# Patient Record
Sex: Female | Born: 1985 | Race: Asian | Hispanic: No | Marital: Single | Smoking: Never smoker
Health system: Southern US, Community
[De-identification: ages and names within clinical notes are randomized; demographics above are authoritative.]

## PROBLEM LIST (undated history)

## (undated) DIAGNOSIS — G932 Benign intracranial hypertension: Secondary | ICD-10-CM

## (undated) DIAGNOSIS — J189 Pneumonia, unspecified organism: Secondary | ICD-10-CM

## (undated) DIAGNOSIS — K219 Gastro-esophageal reflux disease without esophagitis: Secondary | ICD-10-CM

## (undated) DIAGNOSIS — H4710 Unspecified papilledema: Secondary | ICD-10-CM

## (undated) HISTORY — DX: Pneumonia, unspecified organism: J18.9

## (undated) HISTORY — DX: Benign intracranial hypertension: G93.2

## (undated) HISTORY — DX: Gastro-esophageal reflux disease without esophagitis: K21.9

## (undated) HISTORY — DX: Unspecified papilledema: H47.10

---

## 2011-01-11 ENCOUNTER — Inpatient Hospital Stay (HOSPITAL_COMMUNITY)
Admission: AD | Admit: 2011-01-11 | Discharge: 2011-01-11 | Disposition: A | Discharge status: Home / Self Care | Payer: Medicaid Other | Source: Ambulatory Visit | Attending: Obstetrics and Gynecology | Admitting: Obstetrics and Gynecology

## 2011-01-11 DIAGNOSIS — O4700 False labor before 37 completed weeks of gestation, unspecified trimester: Secondary | ICD-10-CM | POA: Insufficient documentation

## 2011-01-12 ENCOUNTER — Inpatient Hospital Stay (HOSPITAL_COMMUNITY)
Admission: RE | Admit: 2011-01-12 | Discharge: 2011-01-12 | Disposition: A | Discharge status: Home / Self Care | Payer: Medicaid Other | Source: Ambulatory Visit | Attending: Obstetrics and Gynecology | Admitting: Obstetrics and Gynecology

## 2011-01-12 DIAGNOSIS — O4700 False labor before 37 completed weeks of gestation, unspecified trimester: Secondary | ICD-10-CM | POA: Insufficient documentation

## 2011-02-14 ENCOUNTER — Encounter (HOSPITAL_COMMUNITY): Payer: Self-pay | Admitting: *Deleted

## 2011-02-14 ENCOUNTER — Inpatient Hospital Stay (HOSPITAL_COMMUNITY)
Admission: EM | Admit: 2011-02-14 | Discharge: 2011-02-16 | DRG: 775 | Disposition: A | Discharge status: Home / Self Care | Payer: Medicaid Other | Source: Ambulatory Visit | Attending: Obstetrics and Gynecology | Admitting: Obstetrics and Gynecology

## 2011-02-14 DIAGNOSIS — O30009 Twin pregnancy, unspecified number of placenta and unspecified number of amniotic sacs, unspecified trimester: Secondary | ICD-10-CM | POA: Diagnosis present

## 2011-02-14 DIAGNOSIS — O30049 Twin pregnancy, dichorionic/diamniotic, unspecified trimester: Secondary | ICD-10-CM

## 2011-02-14 DIAGNOSIS — O3090 Multiple gestation, unspecified, unspecified trimester: Secondary | ICD-10-CM | POA: Diagnosis present

## 2011-02-14 LAB — CBC
MCV: 81.3 fL (ref 78.0–100.0)
Platelets: 151 10*3/uL (ref 150–400)
RBC: 4.93 MIL/uL (ref 3.87–5.11)
RDW: 17.3 % — ABNORMAL HIGH (ref 11.5–15.5)
WBC: 6.9 10*3/uL (ref 4.0–10.5)

## 2011-02-14 LAB — RPR: RPR Ser Ql: NONREACTIVE

## 2011-02-15 LAB — CBC
Hemoglobin: 10.6 g/dL — ABNORMAL LOW (ref 12.0–15.0)
MCH: 24.9 pg — ABNORMAL LOW (ref 26.0–34.0)
Platelets: 133 10*3/uL — ABNORMAL LOW (ref 150–400)
RBC: 4.25 MIL/uL (ref 3.87–5.11)

## 2011-02-17 ENCOUNTER — Ambulatory Visit (HOSPITAL_COMMUNITY)
Admission: AD | Admit: 2011-02-17 | Discharge: 2011-02-19 | Disposition: A | Discharge status: Home / Self Care | Payer: Medicaid Other | Source: Ambulatory Visit | Attending: Obstetrics and Gynecology | Admitting: Obstetrics and Gynecology

## 2011-02-17 ENCOUNTER — Inpatient Hospital Stay (HOSPITAL_COMMUNITY): Payer: Medicaid Other

## 2011-02-17 DIAGNOSIS — O8612 Endometritis following delivery: Secondary | ICD-10-CM | POA: Insufficient documentation

## 2011-02-17 DIAGNOSIS — N719 Inflammatory disease of uterus, unspecified: Secondary | ICD-10-CM | POA: Insufficient documentation

## 2011-02-17 LAB — COMPREHENSIVE METABOLIC PANEL
BUN: 10 mg/dL (ref 6–23)
CO2: 24 mEq/L (ref 19–32)
Calcium: 8.2 mg/dL — ABNORMAL LOW (ref 8.4–10.5)
Creatinine, Ser: 0.72 mg/dL (ref 0.4–1.2)
GFR calc non Af Amer: >60 mL/min (ref >60)
Glucose, Bld: 80 mg/dL (ref 70–99)

## 2011-02-17 LAB — CBC
HCT: 34.9 % — ABNORMAL LOW (ref 36.0–46.0)
Platelets: 140 10*3/uL — ABNORMAL LOW (ref 150–400)
RBC: 4.35 MIL/uL (ref 3.87–5.11)
RDW: 17.6 % — ABNORMAL HIGH (ref 11.5–15.5)
WBC: 12 10*3/uL — ABNORMAL HIGH (ref 4.0–10.5)

## 2011-02-17 LAB — URINALYSIS, ROUTINE W REFLEX MICROSCOPIC
Nitrite: NEGATIVE
Specific Gravity, Urine: 1.01 (ref 1.005–1.030)
Urobilinogen, UA: 0.2 mg/dL (ref 0.0–1.0)
pH: 7.5 (ref 5.0–8.0)

## 2011-02-17 LAB — URINE MICROSCOPIC-ADD ON

## 2011-02-19 LAB — WOUND CULTURE

## 2011-02-21 NOTE — Discharge Summary (Signed)
NAMESIRI, BUEGE NO.:  [id number]  MEDICAL RECORD NO.:  [id number]           PATIENT TYPE:  I  LOCATION:  9106                          [facility]ILITY:  [facility]  PHYSICIAN:  Zenaida Niece, M.D.DATE OF BIRTH:  17-Apr-1986  DATE OF ADMISSION:  02/14/2011 DATE OF DISCHARGE:  02/16/2011                              DISCHARGE SUMMARY   ADMISSION DIAGNOSIS:  Intrauterine pregnancy with dichorionic-diamniotic twins at 37 weeks.  DISCHARGE DIAGNOSIS:  Intrauterine pregnancy with dichorionic-diamniotic twins at 37 weeks.  PROCEDURES:  On May 28, she had vacuum-assisted vaginal delivery of twins.  HISTORY AND PHYSICAL:  This is a 25 year old gravida 2, para 0-0-1-0 with an EGA of 37+ weeks with di-di twins who presents for induction. Prenatal care complicated only by the twins with concordant growth and vertex vertex by last ultrasound.  PRENATAL LABORATORY FINDINGS:  Blood type AB positive with negative antibody screen, rubella immune, hepatitis B surface antigen negative. Quad screen normal.  Glucola 145.  GTT normal.  Group B strep is negative.  PAST OBSTETRIC HISTORY:  One elective termination.  PAST MEDICAL HISTORY:  Chlamydia.  PAST SURGICAL HISTORY:  D and E.  Physical exam, she is afebrile with stable vital signs.  Fetal heart tracing category 1 x2 with contractions every 3-4 minutes on Pitocin. Abdomen gravid, nontender.  Cervix is 5-6, 80, -1, vertex presentation and membranes were ruptured revealing clear fluid.  HOSPITAL COURSE:  The patient was admitted and started on low-dose Pitocin.  I then ruptured membranes also for augmentation.  She progressed to 7 cm and a fetal scalp electrode was placed on baby A. She then had a slightly protracted course but did eventually reached complete and pushed for approximately 2-1/2 to 3 hours.  She did not push well.  She did have an epidural.  Risks of assisted delivery were discussed and she agreed.   Vacuum-assisted vaginal delivery of baby A was performed for a viable female infant with Apgars of 8 and 9 that weighed 6 pounds 14 ounces.  Baby B was then found to be in the breech presentation.  External cephalic version was performed to convert the baby to vertex.  Membranes were ruptured revealing clear fluid.  She was able to bring the baby vertex to +2 but then was unable to push past there.  Vacuum-assisted vaginal delivery of baby B was then performed for a viable female infant with Apgars of 8 and 9 that weighed 6 pounds 7 ounces.  Placenta was delivered spontaneously separately.  She had a third-degree laceration repaired with 2-0 Vicryl for the sphincter and 3- 0 Vicryl for the remainder and rectum was intact.  Estimated blood loss was 600 mL.  Postpartum, she had no significant complications.  She did have temperature to 100.1 but no higher.  Predelivery hemoglobin 12.6, postdelivery 10.6.  On postpartum day #2, she was felt to be stable enough for discharge home.  DISCHARGE INSTRUCTIONS:  Regular diet, pelvic rest.  Follow up in 6 weeks. Medications; ibuprofen 600 mg p.o. q.6 h. p.r.n. #30 and Colace #100 one p.o. b.i.d.  Zenaida Niece, M.D.     TDM/MEDQ  D:  02/16/2011  T:  02/16/2011  Job:  [id number]  Electronically Signed by Lavina Hamman M.D. on 02/21/2011 08:14:58 AM

## 2011-02-23 LAB — CULTURE, BLOOD (ROUTINE X 2)
Culture  Setup Time: 201205311934
Culture  Setup Time: 201205311934
Culture: NO GROWTH
Culture: NO GROWTH

## 2011-02-24 LAB — ANAEROBIC CULTURE

## 2011-03-01 ENCOUNTER — Inpatient Hospital Stay (HOSPITAL_COMMUNITY): Admission: AD | Admit: 2011-03-01 | Payer: Self-pay | Source: Home / Self Care | Admitting: Family Medicine

## 2011-03-01 NOTE — Discharge Summary (Signed)
  [name], [name] NO.:  [id number]  MEDICAL RECORD NO.:  [id number]  LOCATION:  9305                          [facility]ILITY[facility]  [facility]  PHYSICIAN:  Zenaida Niece, M.D.DATE OF BIRTH:  1986-01-02  DATE OF ADMISSION:  02/17/2011 DATE OF DISCHARGE:  02/19/2011                              DISCHARGE SUMMARY   ADMISSION DIAGNOSIS:  Postpartum day #3 with probable postpartum endometritis.  DISCHARGE DIAGNOSIS:  Postpartum day #3 with probable postpartum endometritis.  PROCEDURES:  IV antibiotics.  HISTORY AND PHYSICAL:  This is a 25 year old Asian female, para 1-0-1-2 who is 3 days status post spontaneous vaginal delivery of twins.  She was discharged on the afternoon of Feb 16, 2011.  Postpartum course was uncomplicated.  No significant fever, although she did have temperatures up to 100, but no other signs or symptoms of infection.  She presents with complaint of chills and shaking, and temperature to 103.  She has some lower abdominal pain, but no other symptoms.  PHYSICAL EXAMINATION:  VITAL SIGNS:  Significant for temperature of 103.2, blood pressure 144/70, pulse 102. HEENT:  Pharynx was clear. HEART:  Tachycardic, no murmur. LUNGS:  Clear. BREASTS:  Soft, no mass or tenderness. ABDOMEN:  Fundus was firm and deviated to the right and was tender above the umbilicus. PELVIC:  Had normal lochia.  Cervix was clear.  Uterus was tender.  HOSPITAL COURSE:  The patient was admitted by Dr. Ambrose Mantle after evaluation.  He ordered blood, urine, and endometrial cultures.  She was started on IV Unasyn.  Later that day within 12 hours of starting antibiotics, she had another fever, so Dr. Ellyn Hack changed her to Zosyn. T-max on Feb 17, 2011 was 102.9.  On the morning of February 18, 2011, uterus was still tender, at that point, she had been afebrile for about 12 hours.  Cultures at that point were negative.  She then continue to remain afebrile on Zosyn and on the morning of  February 19, 2011, had been afebrile for greater than 36 hours.  Fundus had decreased uterine tenderness.  Again, all cultures were negative.  She was felt to be stable enough for discharge home.  DISCHARGE INSTRUCTIONS:  Regular diet, pelvic rest.  Follow up in several days to make sure she is doing well and she is discharged on Augmentin 875 mg p.o. b.i.d. for 7 days.  She is to continue over-the- counter ibuprofen as needed.     Zenaida Niece, M.D.     TDM/MEDQ  D:  02/24/2011  T:  02/24/2011  Job:  [id number]  Electronically Signed by Lavina Hamman M.D. on 03/01/2011 08:34:37 AM

## 2011-03-04 NOTE — H&P (Signed)
Caroline Charles, CHICO NO.:  [id number]  MEDICAL RECORD NO.:  [id number]           PATIENT TYPE:  O  LOCATION:  [facility]                         [facility]  PHYSICIAN:  Malachi Pro. Ambrose Mantle, M.D. DATE OF BIRTH:  [birth year]  DATE OF ADMISSION:  02/17/2011 DATE OF DISCHARGE:                             HISTORY & PHYSICAL   This is a 25 year old Asian female para 1-0-1-2 admitted 3 days postpartum with fever to 103.2 degrees.  This patient delivered twins on the evening of Feb 14, 2011.  The labor was induced, both twins were delivered vertex, the first by vacuum after about 3 hours of pushing. The second by vacuum after external rotation from breech to vertex.  On the first postpartum day, the patient had temperature elevation to 100.0 degrees and she reports that a couple of hours after she delivered, she had severe chills.  After discharge, she did well until approximately 11:45 p.m. on the day of discharge Feb 16, 2011, when she had the onset of chills and was shaking so much she could not control her body.  She came here for evaluation.  She denied sore throat, cough, neck stiffness, dysuria.  She has had some lower abdominal pain.  PAST MEDICAL HISTORY:  She has no known allergies.  OPERATIONS:  She did have a D and E in 2006 for abortion.  She gave no significant medical history.  Denied alcohol, tobacco, and drugs.  FAMILY HISTORY:  Mother had high blood pressure.  OBSTETRICAL HISTORY:  In 2006, she had a therapeutic abortion and on Feb 14, 2011, she delivered twins, both vaginally, baby A was a 6 pounds 14 ounce female, baby B a 6 pounds 7 ounce female.  PHYSICAL EXAMINATION:  VITAL SIGNS:  Temperature is 103.2, blood pressure 144/70, pulse 102, respirations 18. GENERAL:  She is a well-developed, well-nourished Asian female.  She is in distress from high fever. NECK:  Pharynx is clear.  The neck is supple. HEART:  Normal size and sounds.  There is a mild  tachycardia.  There are no murmurs. LUNGS:  Clear to auscultation. BREASTS:  Soft.  There is no redness or tenderness. ABDOMEN:  Soft. EXTERNAL GENITALIA:  Uterus is enlarged above the umbilicus, deviated to the right, and fundus is palpable in the right upper quadrant.  Uterus is quite tender.  The legs are negative for erythema or a unilateral swelling.  Vulva and vagina show moderate dark lochia.  The cervix is clean.  The uterus is tender and enlarged, deviated to the right.  The adnexa are clear.  ADMITTING IMPRESSION:  Presumed endometritis.  Cath urine revealed only 3 to 6 white cells, few bacteria.  It is sent for culture.  CBC is normal except for white count of 12,000, platelet count of 140,000. Comprehensive metabolic profile is normal except for slightly low potassium, protein, and albumin.Marland Kitchen  Ultrasound revealed no evidence of retained products of conception but the endometrial complex was suggestive of endometritis.  The patient has been given Tylenol for fever, placed on Unasyn after cultures were obtained from the blood and endometrial  cavity.     Malachi Pro. Ambrose Mantle, M.D.     TFH/MEDQ  D:  02/17/2011  T:  02/17/2011  Job:  [id number]  Electronically Signed by Tracey Harries M.D. on 03/04/2011 08:56:54 AM

## 2011-03-28 ENCOUNTER — Other Ambulatory Visit: Payer: Self-pay | Admitting: Obstetrics and Gynecology

## 2011-03-28 DIAGNOSIS — N63 Unspecified lump in unspecified breast: Secondary | ICD-10-CM

## 2011-03-29 ENCOUNTER — Ambulatory Visit
Admission: RE | Admit: 2011-03-29 | Discharge: 2011-03-29 | Disposition: A | Discharge status: Home / Self Care | Payer: Medicaid Other | Source: Ambulatory Visit | Attending: Obstetrics and Gynecology | Admitting: Obstetrics and Gynecology

## 2011-03-29 DIAGNOSIS — N63 Unspecified lump in unspecified breast: Secondary | ICD-10-CM

## 2014-07-21 ENCOUNTER — Encounter (HOSPITAL_COMMUNITY): Payer: Self-pay | Admitting: *Deleted

## 2016-02-26 ENCOUNTER — Telehealth: Payer: Self-pay | Admitting: Gastroenterology

## 2016-02-26 ENCOUNTER — Encounter (HOSPITAL_COMMUNITY): Payer: Self-pay | Admitting: Emergency Medicine

## 2016-02-26 ENCOUNTER — Emergency Department (HOSPITAL_COMMUNITY)
Admission: EM | Admit: 2016-02-26 | Discharge: 2016-02-26 | Disposition: A | Discharge status: Home / Self Care | Payer: BLUE CROSS/BLUE SHIELD | Attending: Emergency Medicine | Admitting: Emergency Medicine

## 2016-02-26 DIAGNOSIS — R0989 Other specified symptoms and signs involving the circulatory and respiratory systems: Secondary | ICD-10-CM | POA: Insufficient documentation

## 2016-02-26 MED ORDER — LIDOCAINE VISCOUS 2 % MT SOLN
15.0000 mL | Freq: Once | OROMUCOSAL | Status: AC
  Administered 2016-02-26: 15 mL via OROMUCOSAL
  Filled 2016-02-26: qty 15

## 2016-02-26 NOTE — Telephone Encounter (Signed)
Caroline Charles from patient PCP office states that this referral is not needed. She states that patient PCP has advised patient to go to the ED.

## 2016-02-26 NOTE — ED Notes (Signed)
Feels like that there is something in throat after eating last night ,  Pt has NO  Breathing issues and is handling secretions very well pulse ox 100 states may be lemon grass

## 2016-03-11 NOTE — ED Provider Notes (Signed)
CSN: [id number]     Arrival date & time 02/26/16  1119 History   First MD Initiated Contact with Patient 02/26/16 1205     Chief Complaint  Patient presents with  . Foreign Body     (Consider location/radiation/quality/duration/timing/severity/associated sxs/prior Treatment) HPI   30 year old female with foreign body sensation in her throat. Patient receiving a normal at the time symptom onset. She feels like she may have a piece of grass stuck in her throat. She can swallow both solids and liquids. They stay down. Her irritation/pain is worse with swallowing though. No cough. No drooling. No wheezing. No intervention prior to arrival.  History reviewed. No pertinent past medical history. History reviewed. No pertinent past surgical history. No family history on file. Social History  Substance Use Topics  . Smoking status: Never Smoker   . Smokeless tobacco: None  . Alcohol Use: None   OB History    Gravida Para Term Preterm AB TAB SAB Ectopic Multiple Living   1              Review of Systems  All systems reviewed and negative, other than as noted in HPI.   Allergies  Review of patient's allergies indicates no known allergies.  Home Medications   Prior to Admission medications   Not on File   BP 126/84 mmHg  Pulse 65  Temp(Src) 98.2 F (36.8 C) (Oral)  Resp 16  SpO2 100% Physical Exam  Constitutional: She appears well-developed and well-nourished. No distress.  HENT:  Head: Normocephalic and atraumatic.  Oropharynx is clear. Normal sounding voice. Handling secretions. Neck is supple. No stridor.  Eyes: Conjunctivae are normal. Right eye exhibits no discharge. Left eye exhibits no discharge.  Neck: Neck supple.  Cardiovascular: Normal rate, regular rhythm and normal heart sounds.  Exam reveals no gallop and no friction rub.   No murmur heard. Pulmonary/Chest: Effort normal and breath sounds normal. No respiratory distress.  Abdominal: Soft. She exhibits no  distension. There is no tenderness.  Musculoskeletal: She exhibits no edema or tenderness.  Neurological: She is alert.  Skin: Skin is warm and dry.  Psychiatric: She has a normal mood and affect. Her behavior is normal. Thought content normal.  Nursing note and vitals reviewed.   ED Course  Procedures (including critical care time) Labs Review Labs Reviewed - No data to display  Imaging Review No results found. I have personally reviewed and evaluated these images and lab results as part of my medical decision-making.   EKG Interpretation None      MDM   Final diagnoses:  Foreign body sensation in throat    30 year old female with foreign body sensation in her throat. Suspect esophageal irritation. Doubt foreign body. She can swallow both solids and liquids. She is in no distress. Symptoms improved with viscous lidocaine. It has been determined that no acute conditions requiring further emergency intervention are present at this time. The patient has been advised of the diagnosis and plan. I reviewed any labs and imaging including any potential incidental findings. We have discussed signs and symptoms that warrant return to the ED and they are listed in the discharge instructions.      Raeford RazorStephen Paiton Boultinghouse, MD 03/11/16 2109

## 2019-11-21 ENCOUNTER — Ambulatory Visit: Payer: Self-pay

## 2019-11-24 ENCOUNTER — Ambulatory Visit: Payer: Self-pay | Attending: Internal Medicine

## 2019-11-24 DIAGNOSIS — Z23 Encounter for immunization: Secondary | ICD-10-CM | POA: Insufficient documentation

## 2019-11-24 NOTE — Progress Notes (Signed)
   Covid-19 Vaccination Clinic  Name:  Caroline Charles    MRN: [id number] DOB: 06/26/1986  11/24/2019  [name] was observed post Covid-19 immunization for 15 minutes without incident. She was provided with Vaccine Information Sheet and instruction to access the V-Safe system.   [name] was instructed to call 911 with any severe reactions post vaccine: Marland Kitchen Difficulty breathing  . Swelling of face and throat  . A fast heartbeat  . A bad rash all over body  . Dizziness and weakness   Immunizations Administered    Name Date Dose VIS Date Route   Pfizer COVID-19 Vaccine 11/24/2019 12:44 PM 0.3 mL 08/30/2019 Intramuscular   Manufacturer: [employer], [employer]   Lot: [id number]   NDC: [address]-5

## 2019-12-25 ENCOUNTER — Ambulatory Visit: Payer: Self-pay | Attending: Internal Medicine

## 2019-12-25 DIAGNOSIS — Z23 Encounter for immunization: Secondary | ICD-10-CM

## 2019-12-25 NOTE — Progress Notes (Signed)
   Covid-19 Vaccination Clinic  Name:  Caroline Charles    MRN: [id number] DOB: 1986/04/17  12/25/2019  Caroline Charles was observed post Covid-19 immunization for 15 minutes without incident. She was provided with Vaccine Information Sheet and instruction to access the V-Safe system.   Caroline Charles was instructed to call 911 with any severe reactions post vaccine: Marland Kitchen Difficulty breathing  . Swelling of face and throat  . A fast heartbeat  . A bad rash all over body  . Dizziness and weakness   Immunizations Administered    Name Date Dose VIS Date Route   Pfizer COVID-19 Vaccine 12/25/2019  8:06 AM 0.3 mL 08/30/2019 Intramuscular   Manufacturer: [employer], [employer]   Lot: [id number]   NDC: [address]-8

## 2020-08-21 LAB — HM PAP SMEAR: HM Pap smear: NORMAL

## 2020-08-21 LAB — RESULTS CONSOLE HPV: CHL HPV: NEGATIVE

## 2021-10-05 DIAGNOSIS — H4711 Papilledema associated with increased intracranial pressure: Secondary | ICD-10-CM | POA: Diagnosis not present

## 2021-10-08 ENCOUNTER — Other Ambulatory Visit: Payer: Self-pay | Admitting: Ophthalmology

## 2021-10-08 DIAGNOSIS — H4711 Papilledema associated with increased intracranial pressure: Secondary | ICD-10-CM

## 2021-10-21 ENCOUNTER — Other Ambulatory Visit: Payer: Self-pay

## 2021-10-21 ENCOUNTER — Ambulatory Visit
Admission: RE | Admit: 2021-10-21 | Discharge: 2021-10-21 | Disposition: A | Discharge status: Home / Self Care | Payer: Self-pay | Source: Ambulatory Visit | Attending: Ophthalmology | Admitting: Ophthalmology

## 2021-10-21 DIAGNOSIS — H4711 Papilledema associated with increased intracranial pressure: Secondary | ICD-10-CM | POA: Diagnosis not present

## 2021-10-21 DIAGNOSIS — H4710 Unspecified papilledema: Secondary | ICD-10-CM | POA: Diagnosis not present

## 2021-10-21 DIAGNOSIS — R930 Abnormal findings on diagnostic imaging of skull and head, not elsewhere classified: Secondary | ICD-10-CM | POA: Diagnosis not present

## 2021-10-21 MED ORDER — GADOBENATE DIMEGLUMINE 529 MG/ML IV SOLN
15.0000 mL | Freq: Once | INTRAVENOUS | Status: AC | PRN
  Administered 2021-10-21: 15 mL via INTRAVENOUS

## 2021-10-23 ENCOUNTER — Other Ambulatory Visit: Payer: Self-pay

## 2021-10-23 ENCOUNTER — Ambulatory Visit
Admission: RE | Admit: 2021-10-23 | Discharge: 2021-10-23 | Disposition: A | Discharge status: Home / Self Care | Payer: Self-pay | Source: Ambulatory Visit | Attending: Ophthalmology | Admitting: Ophthalmology

## 2021-10-23 DIAGNOSIS — Q283 Other malformations of cerebral vessels: Secondary | ICD-10-CM | POA: Diagnosis not present

## 2021-10-23 DIAGNOSIS — H4711 Papilledema associated with increased intracranial pressure: Secondary | ICD-10-CM

## 2021-10-23 DIAGNOSIS — R930 Abnormal findings on diagnostic imaging of skull and head, not elsewhere classified: Secondary | ICD-10-CM | POA: Diagnosis not present

## 2021-10-23 MED ORDER — GADOBENATE DIMEGLUMINE 529 MG/ML IV SOLN
15.0000 mL | Freq: Once | INTRAVENOUS | Status: AC | PRN
  Administered 2021-10-23: 15 mL via INTRAVENOUS

## 2021-11-16 ENCOUNTER — Ambulatory Visit: Payer: 59 | Admitting: Neurology

## 2022-01-07 NOTE — Progress Notes (Addendum)
? ?NEUROLOGY CONSULTATION NOTE ? ?[name] ?MRN: [id number] ?DOB: 11/27/1985 ? ?Referring provider: Arlys John, MD ?Primary care provider: No PCP ? ?Reason for consult:  intracranial hypertension ? ?Assessment/Plan:  ? ?Idiopathic intracranial hypertension ? ?Establish diagnosis with lumbar puncture to record opening pressure - will check for CSF cell count, protein, glucose, gram stain and culture. ?Pending LP results, will start acetazolamide 500mg  twice daily ?Advised to have Mirena removed as it has been documented to potentially cause increased intracranial pressure ?Recommend weight loss ?Follow up in late July. ? ? ?Subjective:  ?Caroline Charles is a 36 year old female right who presents for evaluation of intracranial hypertension.  History supplemented by referring provider's note. ? ? ?On routine eye exam, she was evaluated by ophthalmology who noted bilateral papilledema on exam.  Visual field testing was normal.  MRI of brain, orbits and MRV of head with and without contrast performed on 2/2 and 10/23/2021 personally reviewed revealed partially empty sella, mild CSF prominence within the optic nerve sheaths, subtle intra-ocular protrusion of the optic nerve head and possible narrowing of the bilateral distal transverse sinuses but no mass lesion or dural sinus thrombosis noted.  She does have a Mirena IUD.  Reports some weight gain over the past year.  She reports history of migraines which became almost daily due to presumed stress as they resolved after receiving MRI results stating there was no tumor.  Denies visual obscurations or blurred vision.  Hears what sounds like "crickets" in her head but no tinnitus or pulsatile tinnitus.   ? ?Migraine are typically 8/10 pressure in temples and behind both eyes associated with photophobia and sometimes nausea, usually lasts 2-3 days. ?  ? ? ?PAST MEDICAL HISTORY: ?No past medical history on file. ? ? ?PAST SURGICAL HISTORY: ?No past surgical history on  file. ? ? ?MEDICATIONS: ?Current Outpatient Medications on File Prior to Visit  ?Medication Sig Dispense Refill  ? levonorgestrel (MIRENA, 52 MG,) 20 MCG/DAY IUD Mirena 21 mcg/24 hours (8 yrs) 52 mg intrauterine device ? Take 1 device by intrauterine route.    ? ?No current facility-administered medications on file prior to visit.  ? ? ? ?ALLERGIES: ?No Known Allergies ? ? ?FAMILY HISTORY: ?No family history on file. ? ? ?Objective:  ?Blood pressure 136/83, pulse 93, height 5\' 5"  (1.651 m), weight 175 lb 9.6 oz (79.7 kg), SpO2 98 %, unknown if currently breastfeeding. ?General: No acute distress.  Patient appears well-groomed.   ?Head:  Normocephalic/atraumatic ?Eyes:  fundi examined but not visualized ?Neck: supple, no paraspinal tenderness, full range of motion ?Heart: regular rate and rhythm ?Neurological Exam: ?Mental status: alert and oriented to person, place, and time, recent and remote memory intact, fund of knowledge intact, attention and concentration intact, speech fluent and not dysarthric, language intact. ?Cranial nerves: ?CN I: not tested ?CN II: pupils equal, round and reactive to light, visual fields intact ?CN III, IV, VI:  full range of motion, no nystagmus, no ptosis ?CN V: facial sensation intact. ?CN VII: upper and lower face symmetric ?CN VIII: hearing intact ?CN IX, X: gag intact, uvula midline ?CN XI: sternocleidomastoid and trapezius muscles intact ?CN XII: tongue midline ?Bulk & Tone: normal, no fasciculations. ?Motor:  muscle strength 5/5 throughout ?Sensation:  Temperature and vibratory sensation intact. ?Deep Tendon Reflexes:  2+ throughout,  toes downgoing.   ?Finger to nose testing:  Without dysmetria.   ?Heel to shin:  Without dysmetria.   ?Gait:  Normal station and stride.  Romberg negative. ? ? ? ?Thank you for allowing me to take part in the care of this patient. ? ?Shon Millet, DO ? ?CC: Arlys John, MD ? ? ? ? ?

## 2022-01-09 DIAGNOSIS — J301 Allergic rhinitis due to pollen: Secondary | ICD-10-CM | POA: Diagnosis not present

## 2022-01-10 ENCOUNTER — Encounter: Payer: Self-pay | Admitting: Neurology

## 2022-01-10 ENCOUNTER — Ambulatory Visit: Payer: 59 | Admitting: Neurology

## 2022-01-10 VITALS — BP 136/83 | HR 93 | Ht 65.0 in | Wt 175.6 lb

## 2022-01-10 DIAGNOSIS — G932 Benign intracranial hypertension: Secondary | ICD-10-CM

## 2022-01-10 NOTE — Patient Instructions (Signed)
Will schedule for spinal tap to measure opening pressure (check cell count, protein, glucose, gram stain and culture).  Will then likely start acetazolamide to help reduce pressure ?Recommend having the Mirena removed ?Try to continue trying to lose weight ?Follow up in late July. ? ?Idiopathic Intracranial Hypertension ? ?Idiopathic intracranial hypertension (IIH) is a condition that increases pressure around the brain. The fluid that surrounds the brain and spinal cord (cerebrospinal fluid, or CSF) increases and causes the pressure. Idiopathic means that the cause of this condition is not known. ?IIH affects the brain and spinal cord (neurological disorder). If this condition is not treated, it can cause vision loss or blindness. ?What are the causes? ?The cause of this condition is not known. ?What increases the risk? ?The following factors may make you more likely to develop this condition: ?Being very overweight (obese). ?Being a female between the ages of 6 and 90 years old, who has not gone through menopause. ?Taking certain medicines, such as birth control or steroids. ?What are the signs or symptoms? ?Symptoms of this condition include: ?Headaches. This is the most common symptom. ?Brief episodes of total blindness. ?Double vision, blurred vision, or poor side (peripheral) vision. ?Pain in the shoulders or neck. ?Nausea and vomiting. ?A sound like rushing water or a pulsing sound within the ears (pulsatile tinnitus), or ringing in the ears. ?How is this diagnosed? ?This condition may be diagnosed based on: ?Your symptoms and medical history. ?Imaging tests of the brain, such as: ?CT scan. ?MRI. ?Magnetic resonance venogram (MRV) to check the veins. ?Diagnostic lumbar puncture. This is a procedure to remove and examine a sample of cerebrospinal fluid. This procedure can determine whether too much fluid may be causing IIH. ?A thorough eye exam to check for swelling or nerve damage in the eyes. ?How is this  treated? ?Treatment for this condition depends on the symptoms. The goal of treatment is to decrease the pressure around your brain. Common treatments include: ?Weight loss through healthy eating, salt restriction, and exercise, if you are overweight. ?Medicines to decrease the production of spinal fluid and lower the pressure within your skull. ?Medicines to prevent or treat headaches. ?Other treatments may include: ?Surgery to place drains (shunts) in your brain for removing excess fluid. ?Lumbar puncture to remove excess cerebrospinal fluid. ?Follow these instructions at home: ?If you are overweight or obese, work with your health care provider to lose weight. ?Take over-the-counter and prescription medicines only as told by your health care provider. ?Ask your health care provider if the medicine prescribed to you requires you to avoid driving or using machinery. ?Do not use any products that contain nicotine or tobacco, such as cigarettes, e-cigarettes, and chewing tobacco. If you need help quitting, ask your health care provider. ?Keep all follow-up visits as told by your health care provider. This is important. ?Contact a health care provider if: ?You have changes in your vision, such as: ?Double vision. ?Blurred vision. ?Poor peripheral vision. ?Get help right away if: ?You have any of the following symptoms and they get worse or do not get better: ?Headaches. ?Nausea. ?Vomiting. ?Sudden trouble seeing. ?Summary ?Idiopathic intracranial hypertension (IIH) is a condition that increases pressure around the brain. The cause is not known (is idiopathic). ?The most common symptom of IIH is headaches. Vision changes, pain in the shoulders or neck, nausea, and vomiting may also occur. ?Treatment for this condition depends on your symptoms. The goal of treatment is to decrease the pressure around your brain. ?If you  are overweight or obese, work with your health care provider to lose weight. ?Take over-the-counter  and prescription medicines only as told by your health care provider. ?This information is not intended to replace advice given to you by your health care provider. Make sure you discuss any questions you have with your health care provider. ?Document Revised: 08/17/2019 Document Reviewed: 08/17/2019 ?Elsevier Patient Education ? 2023 [employer]vier Inc[employer] ? ?

## 2022-01-12 ENCOUNTER — Telehealth: Payer: Self-pay | Admitting: Neurology

## 2022-01-12 NOTE — Telephone Encounter (Signed)
The patient would like to know how many scans he sent in for her to get. She received two calls from two  different places ?

## 2022-01-14 NOTE — Telephone Encounter (Signed)
Called patient and informed her that her Lumbar was the only imaging ordered and it was sent to [facility]. Patient stated she got a call from [facility] and from [facility]. I informed patient to disregard [facility] as we have sent the order to [facility] imaging. Patient verbalized understanding and had no further questions or concerns.  ?

## 2022-01-19 ENCOUNTER — Encounter: Payer: Self-pay | Admitting: Radiology

## 2022-01-19 ENCOUNTER — Ambulatory Visit
Admission: RE | Admit: 2022-01-19 | Discharge: 2022-01-19 | Disposition: A | Discharge status: Home / Self Care | Payer: 59 | Source: Ambulatory Visit | Attending: Neurology | Admitting: Neurology

## 2022-01-19 DIAGNOSIS — G932 Benign intracranial hypertension: Secondary | ICD-10-CM

## 2022-01-19 DIAGNOSIS — R519 Headache, unspecified: Secondary | ICD-10-CM | POA: Diagnosis not present

## 2022-01-19 DIAGNOSIS — H4710 Unspecified papilledema: Secondary | ICD-10-CM | POA: Diagnosis not present

## 2022-01-19 NOTE — Discharge Instructions (Signed)

## 2022-01-20 ENCOUNTER — Telehealth: Payer: Self-pay

## 2022-01-20 MED ORDER — ACETAZOLAMIDE ER 500 MG PO CP12
500.0000 mg | ORAL_CAPSULE | Freq: Two times a day (BID) | ORAL | 0 refills | Status: DC
Start: 2022-01-20 — End: 2022-02-15

## 2022-01-20 NOTE — Telephone Encounter (Signed)
-----   Message from Drema Dallas, DO sent at 01/19/2022 11:09 AM EDT ----- ?Spinal tap confirms elevated spinal fluid pressure.  Please contact patient with results and send prescription for acetazolamide 500mg  twice daily.  She should be aware that numbness and tingling may be a side effect so not to be worried IF she experiences this.  She should follow up with her eye doctor in about 8 weeks and have notes sent over to me.   ?

## 2022-01-20 NOTE — Telephone Encounter (Signed)
Acetazolamide 500 mg BID sent to the [facility].  ?

## 2022-01-20 NOTE — Telephone Encounter (Signed)
Patient called in and I advised of instructions on spinal tap, she is going to go to the pharmacy and pick up.   ?

## 2022-01-21 ENCOUNTER — Telehealth: Payer: Self-pay | Admitting: Neurology

## 2022-01-21 ENCOUNTER — Other Ambulatory Visit: Payer: Self-pay

## 2022-01-21 DIAGNOSIS — R519 Headache, unspecified: Secondary | ICD-10-CM

## 2022-01-21 DIAGNOSIS — G971 Other reaction to spinal and lumbar puncture: Secondary | ICD-10-CM

## 2022-01-21 NOTE — Telephone Encounter (Signed)
Pt called in and left a message. She stated she needs Dr. Everlena Cooper to order a blood patch as soon as possible. ?

## 2022-01-21 NOTE — Telephone Encounter (Signed)
LP is ordered  ? ?

## 2022-01-21 NOTE — Telephone Encounter (Signed)
Patient called with questions about taking ibuprofen on top of Diamox? ? ?She already took Diamox today and still has a headache. ?

## 2022-01-25 LAB — CSF CELL COUNT WITH DIFFERENTIAL
RBC Count, CSF: 7 cells/uL — ABNORMAL HIGH
TOTAL NUCLEATED CELL: 3 cells/uL (ref 0–5)

## 2022-01-25 LAB — CSF CULTURE W GRAM STAIN
GRAM STAIN:: NONE SEEN
MICRO NUMBER:: 13345729
Result:: NO GROWTH
SPECIMEN QUALITY:: ADEQUATE

## 2022-01-25 LAB — GLUCOSE, CSF: Glucose, CSF: 56 mg/dL (ref 40–80)

## 2022-01-25 LAB — PROTEIN, CSF: Total Protein, CSF: 20 mg/dL (ref 15–45)

## 2022-01-26 DIAGNOSIS — H4711 Papilledema associated with increased intracranial pressure: Secondary | ICD-10-CM | POA: Diagnosis not present

## 2022-01-31 ENCOUNTER — Telehealth: Payer: Self-pay | Admitting: Neurology

## 2022-01-31 NOTE — Telephone Encounter (Signed)
Been taking for diamox 2 weeks - she feels pressure on eye. It makes it  dry. She is uncomfortable, should she be worried. No HA ? ?Jaffe told her to Remove marina- obgyn said she didn't need to remove marina- her doc wants to know why she needs to remove it ? ?

## 2022-01-31 NOTE — Telephone Encounter (Signed)
Patient advised of Dr.Jaffe note, ?Just having pressure isn't anything to be worried about.  If she reports worsening vision, then she needs to get a repeat visual field test with the ophthalmologist.  There has been a link between increased intracranial pressure and Mirena.  That is why I recommend removing the Mirena. ? ?Patient c/o dry eye and her vision can not focus for at a minute or two after taking the Diamox. ?Please advised. ? ?Patient also states her GYN stated that he needs a statement from [facility] why or explanation the link between Mirena and Intracranial Hypertension  ?

## 2022-02-01 ENCOUNTER — Ambulatory Visit: Payer: 59 | Admitting: Neurology

## 2022-02-04 NOTE — Telephone Encounter (Signed)
Pt advised of Dr.Jaffe note, Patient can have the GYN request my notes where I state this.

## 2022-02-04 NOTE — Telephone Encounter (Signed)
No answer at 2:33 02/04/2022

## 2022-02-04 NOTE — Telephone Encounter (Signed)
Number x 2 called now not in service at 3:38 02/04/2022

## 2022-02-13 ENCOUNTER — Other Ambulatory Visit: Payer: Self-pay | Admitting: Neurology

## 2022-02-15 ENCOUNTER — Telehealth: Payer: Self-pay | Admitting: Neurology

## 2022-02-15 MED ORDER — ACETAZOLAMIDE ER 500 MG PO CP12
500.0000 mg | ORAL_CAPSULE | Freq: Two times a day (BID) | ORAL | 4 refills | Status: DC
Start: 2022-02-15 — End: 2022-05-03

## 2022-02-15 NOTE — Telephone Encounter (Signed)
Patient called and stated she needs a refill on Diamox.  She wasn't sure if he was going to refill or decrease the dose. She also stated she will be traveling out of the country in 3 weeks.

## 2022-02-16 DIAGNOSIS — H4711 Papilledema associated with increased intracranial pressure: Secondary | ICD-10-CM | POA: Diagnosis not present

## 2022-02-22 ENCOUNTER — Encounter: Payer: Self-pay | Admitting: Neurology

## 2022-04-07 NOTE — Progress Notes (Deleted)
   NEUROLOGY FOLLOW UP OFFICE NOTE  Caroline Charles [id number]  Assessment/Plan:   Idiopathic intracranial hypertension   Establish diagnosis with lumbar puncture to record opening pressure - will check for CSF cell count, protein, glucose, gram stain and culture. Pending LP results, will start acetazolamide 500mg  twice daily Advised to have Mirena removed as it has been documented to potentially cause increased intracranial pressure Recommend weight loss Follow up in late July.     Subjective:  Caroline Charles is a 36 year old female right who follows up for idiopathic intracranial hypertension.  UPDATE: Current medications:  acetazolamide 500mg  twice daily.   Lumbar puncture on 01/19/2022 revealed opening pressure of 31 cm water.  She was started on acetazolamide.  ***  HISTORY: On routine eye exam, she was evaluated by ophthalmology who noted bilateral papilledema on exam.  Visual field testing was normal.  MRI of brain, orbits and MRV of head with and without contrast performed on 2/2 and 10/23/2021 personally reviewed revealed partially empty sella, mild CSF prominence within the optic nerve sheaths, subtle intra-ocular protrusion of the optic nerve head and possible narrowing of the bilateral distal transverse sinuses but no mass lesion or dural sinus thrombosis noted.  She does have a Mirena IUD.  Reports some weight gain over the past year.  She reports history of migraines which became almost daily due to presumed stress as they resolved after receiving MRI results stating there was no tumor.  Denies visual obscurations or blurred vision.  Hears what sounds like "crickets" in her head but no tinnitus or pulsatile tinnitus.     Migraine are typically 8/10 pressure in temples and behind both eyes associated with photophobia and sometimes nausea, usually lasts 2-3 days.    PAST MEDICAL HISTORY: No past medical history on file.  MEDICATIONS: Current Outpatient Medications on File Prior to  Visit  Medication Sig Dispense Refill   acetaZOLAMIDE ER (DIAMOX) 500 MG capsule Take 1 capsule (500 mg total) by mouth 2 (two) times daily. 60 capsule 4   levonorgestrel (MIRENA, 52 MG,) 20 MCG/DAY IUD Mirena 21 mcg/24 hours (8 yrs) 52 mg intrauterine device  Take 1 device by intrauterine route.     No current facility-administered medications on file prior to visit.    ALLERGIES: No Known Allergies  FAMILY HISTORY: No family history on file.    Objective:  *** General: No acute distress.  Patient appears well-groomed.   Head:  Normocephalic/atraumatic Eyes:  Fundi examined but not visualized Neck: supple, no paraspinal tenderness, full range of motion Heart:  Regular rate and rhythm Neurological Exam: alert and oriented to person, place, and time.  Speech fluent and not dysarthric, language intact.  CN II-XII intact. Bulk and tone normal, muscle strength 5/5 throughout.  Sensation to light touch intact.  Deep tendon reflexes 2+ throughout, toes downgoing.  Finger to nose testing intact.  Gait normal, Romberg negative.   03/21/2022, DO

## 2022-04-11 ENCOUNTER — Ambulatory Visit: Payer: 59 | Admitting: Neurology

## 2022-04-19 DIAGNOSIS — B3731 Acute candidiasis of vulva and vagina: Secondary | ICD-10-CM | POA: Diagnosis not present

## 2022-04-19 DIAGNOSIS — Z30432 Encounter for removal of intrauterine contraceptive device: Secondary | ICD-10-CM | POA: Diagnosis not present

## 2022-04-19 DIAGNOSIS — N760 Acute vaginitis: Secondary | ICD-10-CM | POA: Diagnosis not present

## 2022-04-19 DIAGNOSIS — R300 Dysuria: Secondary | ICD-10-CM | POA: Diagnosis not present

## 2022-05-02 NOTE — Progress Notes (Unsigned)
   NEUROLOGY FOLLOW UP OFFICE NOTE  [name] [id number]  Assessment/Plan:   Idiopathic intracranial hypertension   Establish diagnosis with lumbar puncture to record opening pressure - will check for CSF cell count, protein, glucose, gram stain and culture. Pending LP results, will start acetazolamide 500mg  twice daily Advised to have Mirena removed as it has been documented to potentially cause increased intracranial pressure Recommend weight loss Follow up in late July.     Subjective:  Caroline Charles is a 36 year old female right who follows up for idiopathic intracranial hypertension.  UPDATE: Current medication:  ***  Underwent lumbar puncture on 01/19/2022 which revealed opening pressure of 31 cm H2O.  Started on acetazolamide 500mg  twice daily ***   HISTORY: On routine eye exam, she was evaluated by ophthalmology who noted bilateral papilledema on exam.  Visual field testing was normal.  MRI of brain, orbits and MRV of head with and without contrast performed on 2/2 and 10/23/2021 personally reviewed revealed partially empty sella, mild CSF prominence within the optic nerve sheaths, subtle intra-ocular protrusion of the optic nerve head and possible narrowing of the bilateral distal transverse sinuses but no mass lesion or dural sinus thrombosis noted.  She does have a Mirena IUD.  Reports some weight gain over the past year.  She reports history of migraines which became almost daily due to presumed stress as they resolved after receiving MRI results stating there was no tumor.  Denies visual obscurations or blurred vision.  Hears what sounds like "crickets" in her head but no tinnitus or pulsatile tinnitus.     Migraine are typically 8/10 pressure in temples and behind both eyes associated with photophobia and sometimes nausea, usually lasts 2-3 days.  PAST MEDICAL HISTORY: No past medical history on file.  MEDICATIONS: Current Outpatient Medications on File Prior to Visit   Medication Sig Dispense Refill   acetaZOLAMIDE ER (DIAMOX) 500 MG capsule Take 1 capsule (500 mg total) by mouth 2 (two) times daily. 60 capsule 4   levonorgestrel (MIRENA, 52 MG,) 20 MCG/DAY IUD Mirena 21 mcg/24 hours (8 yrs) 52 mg intrauterine device  Take 1 device by intrauterine route.     No current facility-administered medications on file prior to visit.    ALLERGIES: No Known Allergies  FAMILY HISTORY: No family history on file.    Objective:  *** General: No acute distress.  Patient appears ***-groomed.   Head:  Normocephalic/atraumatic Eyes:  Fundi examined but not visualized Neck: supple, no paraspinal tenderness, full range of motion Heart:  Regular rate and rhythm Lungs:  Clear to auscultation bilaterally Back: No paraspinal tenderness Neurological Exam: alert and oriented to person, place, and time.  Speech fluent and not dysarthric, language intact.  CN II-XII intact. Bulk and tone normal, muscle strength 5/5 throughout.  Sensation to light touch intact.  Deep tendon reflexes 2+ throughout, toes downgoing.  Finger to nose testing intact.  Gait normal, Romberg negative.   , DO  CC: ***

## 2022-05-03 ENCOUNTER — Ambulatory Visit: Payer: 59 | Admitting: Neurology

## 2022-05-03 ENCOUNTER — Encounter: Payer: Self-pay | Admitting: Neurology

## 2022-05-03 VITALS — BP 134/87 | HR 69 | Ht 65.0 in | Wt 160.2 lb

## 2022-05-03 DIAGNOSIS — G932 Benign intracranial hypertension: Secondary | ICD-10-CM

## 2022-05-03 MED ORDER — ACETAZOLAMIDE ER 500 MG PO CP12: 500.0000 mg | ORAL_CAPSULE | Freq: Two times a day (BID) | ORAL | 1 refills | Status: DC

## 2022-05-03 NOTE — Patient Instructions (Signed)
Continue acetazolamide 500mg  twice daily Repeat eye exam and visual field testing in 6 months Follow up with me after these repeat tests

## 2022-05-05 DIAGNOSIS — S63632A Sprain of interphalangeal joint of right middle finger, initial encounter: Secondary | ICD-10-CM | POA: Diagnosis not present

## 2022-05-17 DIAGNOSIS — S62632A Displaced fracture of distal phalanx of right middle finger, initial encounter for closed fracture: Secondary | ICD-10-CM | POA: Diagnosis not present

## 2022-05-17 DIAGNOSIS — M25541 Pain in joints of right hand: Secondary | ICD-10-CM | POA: Diagnosis not present

## 2022-05-17 DIAGNOSIS — S62663A Nondisplaced fracture of distal phalanx of left middle finger, initial encounter for closed fracture: Secondary | ICD-10-CM | POA: Diagnosis not present

## 2022-05-25 ENCOUNTER — Telehealth: Payer: Self-pay | Admitting: Neurology

## 2022-05-25 DIAGNOSIS — Z5181 Encounter for therapeutic drug level monitoring: Secondary | ICD-10-CM

## 2022-05-25 NOTE — Telephone Encounter (Signed)
Patient called and said she'd like a kidney function lab test because of a medication she takes.

## 2022-05-26 NOTE — Telephone Encounter (Signed)
Called patient and informed her that we have placed a lab order as requested. Provided patient with hours of lab and to check in at our office prior to heading to lab. Patient stated she will come on Monday to have labs done.

## 2022-05-30 ENCOUNTER — Other Ambulatory Visit (INDEPENDENT_AMBULATORY_CARE_PROVIDER_SITE_OTHER): Payer: 59

## 2022-05-30 DIAGNOSIS — Z5181 Encounter for therapeutic drug level monitoring: Secondary | ICD-10-CM | POA: Diagnosis not present

## 2022-05-30 LAB — BASIC METABOLIC PANEL
BUN: 13 mg/dL (ref 6–23)
CO2: 21 mEq/L (ref 19–32)
Calcium: 8.9 mg/dL (ref 8.4–10.5)
Chloride: 106 mEq/L (ref 96–112)
Creatinine, Ser: 0.92 mg/dL (ref 0.40–1.20)
GFR: 80.37 mL/min (ref >60.00)
Glucose, Bld: 84 mg/dL (ref 70–99)
Potassium: 3.9 mEq/L (ref 3.5–5.1)
Sodium: 133 mEq/L — ABNORMAL LOW (ref 135–145)

## 2022-06-12 DIAGNOSIS — J209 Acute bronchitis, unspecified: Secondary | ICD-10-CM | POA: Diagnosis not present

## 2022-06-20 DIAGNOSIS — J209 Acute bronchitis, unspecified: Secondary | ICD-10-CM | POA: Diagnosis not present

## 2022-06-28 DIAGNOSIS — B3731 Acute candidiasis of vulva and vagina: Secondary | ICD-10-CM | POA: Diagnosis not present

## 2022-06-28 DIAGNOSIS — J209 Acute bronchitis, unspecified: Secondary | ICD-10-CM | POA: Diagnosis not present

## 2022-07-05 ENCOUNTER — Telehealth: Payer: Self-pay | Admitting: Neurology

## 2022-07-05 MED ORDER — ACETAZOLAMIDE ER 500 MG PO CP12: 500.0000 mg | ORAL_CAPSULE | Freq: Two times a day (BID) | ORAL | 0 refills | Status: DC

## 2022-07-05 NOTE — Telephone Encounter (Signed)
Patient has a question about medication Diamox she thinks she may have taken her RX twice yesterday and she needs to know if she should take her medication this morning   She also will needs a refill on it  [facility]  [address]

## 2022-07-05 NOTE — Telephone Encounter (Signed)
Pt called she was already at work she will take her night dose tonight and get back on track tomorrow, refill sent in for pt,

## 2022-10-13 DIAGNOSIS — R109 Unspecified abdominal pain: Secondary | ICD-10-CM | POA: Diagnosis not present

## 2022-10-17 DIAGNOSIS — Z1389 Encounter for screening for other disorder: Secondary | ICD-10-CM | POA: Diagnosis not present

## 2022-10-17 DIAGNOSIS — Z130 Encounter for screening for diseases of the blood and blood-forming organs and certain disorders involving the immune mechanism: Secondary | ICD-10-CM | POA: Diagnosis not present

## 2022-10-17 DIAGNOSIS — Z6826 Body mass index (BMI) 26.0-26.9, adult: Secondary | ICD-10-CM | POA: Diagnosis not present

## 2022-10-17 DIAGNOSIS — R102 Pelvic and perineal pain: Secondary | ICD-10-CM | POA: Diagnosis not present

## 2022-10-17 DIAGNOSIS — Z01419 Encounter for gynecological examination (general) (routine) without abnormal findings: Secondary | ICD-10-CM | POA: Diagnosis not present

## 2022-10-31 ENCOUNTER — Ambulatory Visit (INDEPENDENT_AMBULATORY_CARE_PROVIDER_SITE_OTHER): Payer: 59 | Admitting: Family Medicine

## 2022-10-31 ENCOUNTER — Encounter: Payer: Self-pay | Admitting: Family Medicine

## 2022-10-31 VITALS — BP 128/84 | HR 77 | Temp 97.8°F | Ht 66.0 in | Wt 162.8 lb

## 2022-10-31 DIAGNOSIS — D751 Secondary polycythemia: Secondary | ICD-10-CM

## 2022-10-31 DIAGNOSIS — Z13220 Encounter for screening for lipoid disorders: Secondary | ICD-10-CM | POA: Diagnosis not present

## 2022-10-31 DIAGNOSIS — R0609 Other forms of dyspnea: Secondary | ICD-10-CM

## 2022-10-31 DIAGNOSIS — U099 Post covid-19 condition, unspecified: Secondary | ICD-10-CM

## 2022-10-31 DIAGNOSIS — E559 Vitamin D deficiency, unspecified: Secondary | ICD-10-CM

## 2022-10-31 DIAGNOSIS — E785 Hyperlipidemia, unspecified: Secondary | ICD-10-CM

## 2022-10-31 DIAGNOSIS — G932 Benign intracranial hypertension: Secondary | ICD-10-CM | POA: Diagnosis not present

## 2022-10-31 DIAGNOSIS — I619 Nontraumatic intracerebral hemorrhage, unspecified: Secondary | ICD-10-CM

## 2022-10-31 LAB — URINALYSIS, ROUTINE W REFLEX MICROSCOPIC
Bilirubin Urine: NEGATIVE
Hgb urine dipstick: NEGATIVE
Ketones, ur: NEGATIVE
Leukocytes,Ua: NEGATIVE
Nitrite: NEGATIVE
RBC / HPF: NONE SEEN (ref 0–?)
Specific Gravity, Urine: 1.025 (ref 1.000–1.030)
Total Protein, Urine: NEGATIVE
Urine Glucose: NEGATIVE
Urobilinogen, UA: 0.2 (ref 0.0–1.0)
pH: 6 (ref 5.0–8.0)

## 2022-10-31 LAB — VITAMIN D 25 HYDROXY (VIT D DEFICIENCY, FRACTURES): VITD: 11.9 ng/mL — ABNORMAL LOW (ref 30.00–100.00)

## 2022-10-31 LAB — COMPREHENSIVE METABOLIC PANEL
ALT: 18 U/L (ref 0–35)
AST: 15 U/L (ref 0–37)
Albumin: 4.5 g/dL (ref 3.5–5.2)
Alkaline Phosphatase: 60 U/L (ref 39–117)
BUN: 13 mg/dL (ref 6–23)
CO2: 19 mEq/L (ref 19–32)
Calcium: 9.1 mg/dL (ref 8.4–10.5)
Chloride: 107 mEq/L (ref 96–112)
Creatinine, Ser: 0.83 mg/dL (ref 0.40–1.20)
GFR: 90.67 mL/min (ref >60.00)
Glucose, Bld: 83 mg/dL (ref 70–99)
Potassium: 3.7 mEq/L (ref 3.5–5.1)
Sodium: 136 mEq/L (ref 135–145)
Total Bilirubin: 0.5 mg/dL (ref 0.2–1.2)
Total Protein: 7.2 g/dL (ref 6.0–8.3)

## 2022-10-31 LAB — FERRITIN: Ferritin: 145.4 ng/mL (ref 10.0–291.0)

## 2022-10-31 LAB — VITAMIN B12: Vitamin B-12: 370 pg/mL (ref 211–911)

## 2022-10-31 LAB — SEDIMENTATION RATE: Sed Rate: 9 mm/hr (ref 0–20)

## 2022-10-31 LAB — LIPID PANEL
Cholesterol: 274 mg/dL — ABNORMAL HIGH (ref 0–200)
HDL: 59.6 mg/dL (ref >39.00)
LDL Cholesterol: 191 mg/dL — ABNORMAL HIGH (ref 0–99)
NonHDL: 214.38
Total CHOL/HDL Ratio: 5
Triglycerides: 116 mg/dL (ref 0.0–149.0)
VLDL: 23.2 mg/dL (ref 0.0–40.0)

## 2022-10-31 LAB — CBC
HCT: 48.7 % — ABNORMAL HIGH (ref 36.0–46.0)
Hemoglobin: 15.7 g/dL — ABNORMAL HIGH (ref 12.0–15.0)
MCHC: 32.2 g/dL (ref 30.0–36.0)
MCV: 85.2 fl (ref 78.0–100.0)
Platelets: 296 10*3/uL (ref 150.0–400.0)
RBC: 5.72 Mil/uL — ABNORMAL HIGH (ref 3.87–5.11)
RDW: 13.4 % (ref 11.5–15.5)
WBC: 6.3 10*3/uL (ref 4.0–10.5)

## 2022-10-31 LAB — C-REACTIVE PROTEIN: CRP: <1 mg/dL (ref 0.5–20.0)

## 2022-10-31 LAB — TSH: TSH: 2.9 u[IU]/mL (ref 0.35–5.50)

## 2022-10-31 MED ORDER — ALBUTEROL SULFATE HFA 108 (90 BASE) MCG/ACT IN AERS: 2.0000 | INHALATION_SPRAY | Freq: Four times a day (QID) | RESPIRATORY_TRACT | 0 refills | Status: DC | PRN

## 2022-10-31 NOTE — Progress Notes (Unsigned)
Assessment/Plan:   Problem List Items Addressed This Visit   None   There are no discontinued medications.    Subjective:  HPI: Encounter date: 10/31/2022  Caroline Charles is a 37 y.o. female who does not have a problem list on file..   She  has a past medical history of IIH (idiopathic intracranial hypertension) and Papilledema..   She presents with chief complaint of Establish Care (Fasting. Had covid 2022 and has had ongoing sob. Would like to know what kind of vitamins she should take.) .   History reviewed. No pertinent surgical history.  Outpatient Medications Prior to Visit  Medication Sig Dispense Refill   acetaZOLAMIDE ER (DIAMOX) 500 MG capsule Take 1 capsule (500 mg total) by mouth 2 (two) times daily. 180 capsule 0   levonorgestrel (MIRENA, 52 MG,) 20 MCG/DAY IUD Mirena 21 mcg/24 hours (8 yrs) 52 mg intrauterine device  Take 1 device by intrauterine route. (Patient not taking: Reported on 10/31/2022)     No facility-administered medications prior to visit.    Family History  Problem Relation Age of Onset   Hypertension Mother    Diabetes Mother    Hyperlipidemia Mother    Ovarian cysts Mother     Social History   Socioeconomic History   Marital status: Single    Spouse name: Not on file   Number of children: Not on file   Years of education: Not on file   Highest education level: Not on file  Occupational History   Not on file  Tobacco Use   Smoking status: Never    Passive exposure: Never   Smokeless tobacco: Not on file  Vaping Use   Vaping Use: Never used  Substance and Sexual Activity   Alcohol use: Not on file   Drug use: Not on file   Sexual activity: Not on file  Other Topics Concern   Not on file  Social History Narrative   Not on file   Social Determinants of Health   Financial Resource Strain: Not on file  Food Insecurity: Not on file  Transportation Needs: Not on file  Physical Activity: Not on file  Stress: Not on file   Social Connections: Not on file  Intimate Partner Violence: Not on file                                                                                                 Objective:  Physical Exam: BP 128/84 (BP Location: Left Arm, Patient Position: Sitting, Cuff Size: Large)   Pulse 77   Temp 97.8 F (36.6 C) (Temporal)   Ht 5' 6"$  (1.676 m)   Wt 162 lb 12.8 oz (73.8 kg)   LMP 10/20/2022   SpO2 98%   BMI 26.28 kg/m    ***General: No acute distress. Awake and conversant.  Eyes: Normal conjunctiva, anicteric. Round symmetric pupils.  ENT: Hearing grossly intact. No nasal discharge.  Neck: Neck is supple. No masses or thyromegaly.  Respiratory: Respirations are non-labored. No auditory wheezing.  Skin: Warm. No rashes or ulcers.  Psych: Alert and oriented. Cooperative, Appropriate mood  and affect, Normal judgment.  CV: No cyanosis or JVD MSK: Normal ambulation. No clubbing  Neuro: Sensation and CN II-XII grossly normal.        Alesia Banda, MD, MS

## 2022-10-31 NOTE — Patient Instructions (Signed)
Start using albuterol inhaler as needed for shortness of breath. We are getting blood work as requested.

## 2022-11-01 DIAGNOSIS — G932 Benign intracranial hypertension: Secondary | ICD-10-CM | POA: Insufficient documentation

## 2022-11-01 DIAGNOSIS — U099 Post covid-19 condition, unspecified: Secondary | ICD-10-CM | POA: Insufficient documentation

## 2022-11-01 MED ORDER — VITAMIN D (ERGOCALCIFEROL) 1.25 MG (50000 UNIT) PO CAPS: 50000.0000 [IU] | ORAL_CAPSULE | ORAL | 0 refills | Status: DC

## 2022-11-01 MED ORDER — ROSUVASTATIN CALCIUM 10 MG PO TABS: 10.0000 mg | ORAL_TABLET | Freq: Every day | ORAL | 3 refills | Status: DC

## 2022-11-01 NOTE — Assessment & Plan Note (Signed)
The patient presents with persistent shortness of breath post-COVID-19 infection that began in 2022, and it has been continuous since then. They also report positional chest pain, which is not consistently present with shortness of breath episodes.  Differential diagnosis:  1.  Post-viral syndrome/Long COVID- Given the onset of symptoms following a COVID-19 infection and the extended duration of symptoms, long COVID is the most likely cause of the patient's ongoing respiratory symptoms.  2.  Chronic respiratory inflammation-the patient's history of pneumonia and chronic cough may have contributed to residual inflammation, which could account for the persistent shortness of breath.  3.  Asthma- Though there is no historical indication of asthma, it can present with intermittent shortness of breath and be worsened by respiratory infections.  4.  Anxiety-related dyspnea (F41.9) - Given the patient's stress and episodes potentially tied to anxiety, it's possible some symptoms may be exacerbating due to an anxiety disorder, though this would not account for all symptoms presented.  Plan: Following the pulmonology referral, the patient will undergo pulmonary function testing to investigate the cause of the chronic shortness of breath, which could highlight issues such as reactive airways or residual lung changes post-COVID. In the meantime, prescribe an albuterol inhaler  to be used as needed for symptomatic relief. Re-evaluate the patient in one month to assess the effectiveness of the inhaler and consider next steps depending on pulmonology findings. Will obtain restratification labs as ordered for assessment for underlying cardiac disease or other causes of dyspnea such as anemia etc.

## 2022-11-03 NOTE — Progress Notes (Signed)
NEUROLOGY FOLLOW UP OFFICE NOTE  Caroline Charles [id number]  Assessment/Plan:   Idiopathic intracranial hypertension - improved and since she has side effects, will start tapering down on the acetazolamide   Decrease acetazolamide to 272m twice daily. Follow up with Dr. HIona Hansenin 3 months for recheck Follow up with me in 6 months.     Subjective:  Caroline MAUZEYis a 37year old female right who follows up for idiopathic intracranial hypertension.  UPDATE: Current medication:  acetazolamide 5046mtwice daily  She had repeat eye exam by Dr. HaIona Hansenn 1/22.  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  She is clinically doing well.  However the acetazolamide is making her dry (skin, eyes) which is uncomfortable   HISTORY: On routine eye exam, she was evaluated by ophthalmology who noted bilateral papilledema on exam.  Visual field testing was normal.  MRI of brain, orbits and MRV of head with and without contrast performed on 2/2 and 10/23/2021 revealed partially empty sella, mild CSF prominence within the optic nerve sheaths, subtle intra-ocular protrusion of the optic nerve head and possible narrowing of the bilateral distal transverse sinuses but no mass lesion or dural sinus thrombosis noted.  She does have a Mirena IUD.  Reports some weight gain over the past year.  She reports history of migraines which became almost daily due to presumed stress as they resolved after receiving MRI results stating there was no tumor.  Denies visual obscurations or blurred vision.  Hears what sounds like "crickets" in her head but no tinnitus or pulsatile tinnitus.  Underwent lumbar puncture on 01/19/2022 which revealed opening pressure of 31 cm H2O.  Started on acetazolamide 50021mwice daily.   Migraine are typically 8/10 pressure in temples and behind both eyes associated with photophobia and sometimes nausea, usually lasts 2-3 days.  PAST MEDICAL HISTORY: Past Medical History:  Diagnosis Date    IIH (idiopathic intracranial hypertension)    Papilledema     MEDICATIONS: Current Outpatient Medications on File Prior to Visit  Medication Sig Dispense Refill   acetaZOLAMIDE ER (DIAMOX) 500 MG capsule Take 1 capsule (500 mg total) by mouth 2 (two) times daily. 180 capsule 0   albuterol (VENTOLIN HFA) 108 (90 Base) MCG/ACT inhaler Inhale 2 puffs into the lungs every 6 (six) hours as needed for wheezing or shortness of breath. 8 g 0   levonorgestrel (MIRENA, 52 MG,) 20 MCG/DAY IUD Mirena 21 mcg/24 hours (8 yrs) 52 mg intrauterine device  Take 1 device by intrauterine route. (Patient not taking: Reported on 10/31/2022)     rosuvastatin (CRESTOR) 10 MG tablet Take 1 tablet (10 mg total) by mouth daily. 90 tablet 3   Vitamin D, Ergocalciferol, (DRISDOL) 1.25 MG (50000 UNIT) CAPS capsule Take 1 capsule (50,000 Units total) by mouth every 7 (seven) days. 5 capsule 0   No current facility-administered medications on file prior to visit.    ALLERGIES: No Known Allergies  FAMILY HISTORY: Family History  Problem Relation Age of Onset   Hypertension Mother    Diabetes Mother    Hyperlipidemia Mother    Ovarian cysts Mother       Objective:  Blood pressure 116/80, pulse 73, height 5' 5"$  (1.651 m), weight 164 lb (74.4 kg), last menstrual period 10/20/2022, SpO2 98 %, unknown if currently breastfeeding. General: No acute distress.  Patient appears well-groomed.   Head:  Normocephalic/atraumatic Eyes:  Fundi examined but not visualized Neck: supple, no paraspinal tenderness, full  range of motion Heart:  Regular rate and rhythm Neurological Exam: alert and oriented to person, place, and time.  Speech fluent and not dysarthric, language intact.  CN II-XII intact. Bulk and tone normal, muscle strength 5/5 throughout.  Sensation to light touch intact.  Deep tendon reflexes 2+ throughout.  Finger to nose testing intact.  Gait normal, Romberg negative.   Metta Clines, DO

## 2022-11-07 ENCOUNTER — Encounter: Payer: Self-pay | Admitting: Neurology

## 2022-11-07 ENCOUNTER — Ambulatory Visit: Payer: 59 | Admitting: Neurology

## 2022-11-07 VITALS — BP 116/80 | HR 73 | Ht 65.0 in | Wt 164.0 lb

## 2022-11-07 DIAGNOSIS — R102 Pelvic and perineal pain: Secondary | ICD-10-CM | POA: Diagnosis not present

## 2022-11-07 DIAGNOSIS — G932 Benign intracranial hypertension: Secondary | ICD-10-CM | POA: Diagnosis not present

## 2022-11-07 MED ORDER — ACETAZOLAMIDE 250 MG PO TABS
250.0000 mg | ORAL_TABLET | Freq: Two times a day (BID) | ORAL | 0 refills | Status: DC
Start: 2022-11-07 — End: 2023-01-24

## 2022-11-10 ENCOUNTER — Other Ambulatory Visit: Payer: Self-pay | Admitting: Obstetrics and Gynecology

## 2022-11-10 DIAGNOSIS — R102 Pelvic and perineal pain: Secondary | ICD-10-CM

## 2022-11-15 NOTE — Progress Notes (Unsigned)
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  CC: dyspnea  36yF with history of IIH, referred for long haul covid, dyspnea  She had covid-19 in summer of 2022, was not hospitalized, symptomaitcally pretty severe though.   If she talks too quickly, or does strenous exertion feels shortness of breath. Some pain in central chest, especially when she lies down. Kind of a pinching/sharp pain. She has had cough which is productive of clearish phlegm - especially over last 3 months. No sinus congestion/postnasal drainage. Sometimes has some heartburn.   She was vaccinated and has had 3 boosters for covid-19  Has been on acetazolamide 500 bid since 01/2022, decreased to 250 mg BID since 11/07/22.  Otherwise pertinent review of systems is negative.  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 Her respiratory issues are not necessarily worse at work. Never smoker, no vaping/MJ. Has lived in [place], [place], [place], has been back in [place] for over 12 years.    Past Medical History:  Diagnosis Date   IIH (idiopathic intracranial hypertension)    Papilledema      Family History  Problem Relation Age of Onset   Hypertension Mother    Diabetes Mother    Hyperlipidemia Mother    Ovarian cysts Mother      No past surgical history on file.  Social History   Socioeconomic History   Marital status: Single    Spouse name: Not on file   Number of children: Not on file   Years of education: Not on file   Highest education level: Not on file  Occupational History   Not on file  Tobacco Use   Smoking status: Never    Passive exposure: Never   Smokeless tobacco: Not on file  Vaping Use   Vaping Use: Never used  Substance and Sexual Activity   Alcohol use:  Not on file   Drug use: Not on file   Sexual activity: Not on file  Other Topics Concern   Not on file  Social History Narrative   Not on file   Social Determinants of Health   Financial Resource Strain: Not on file  Food Insecurity: Not on file  Transportation Needs: Not on file  Physical Activity: Not on file  Stress: Not on file  Social Connections: Not on file  Intimate Partner Violence: Not on file     No Known Allergies   Outpatient Medications Prior to Visit  Medication Sig Dispense Refill   acetaZOLAMIDE (DIAMOX) 250 MG tablet Take 1 tablet (250 mg total) by mouth 2 (two) times daily. 180 tablet 0   albuterol (VENTOLIN HFA) 108 (90 Base) MCG/ACT inhaler Inhale 2 puffs into the lungs every 6 (six) hours as needed for wheezing or shortness of breath. 8 g 0   rosuvastatin (CRESTOR) 10 MG tablet Take 1 tablet (10 mg total) by mouth daily. 90 tablet 3   Vitamin D, Ergocalciferol, (DRISDOL) 1.25 MG (50000 UNIT) CAPS capsule Take 1 capsule (50,000 Units total) by mouth every 7 (seven) days. 5 capsule 0   No facility-administered medications prior to visit.       Objective:   Physical Exam:  General appearance: 37 y.o., female, NAD, conversant  Eyes: anicteric sclerae; PERRL, tracking appropriately  HENT: NCAT; MMM Neck: Trachea midline; no lymphadenopathy, no JVD Lungs: CTAB, no crackles, no wheeze, with normal respiratory effort CV: RRR, no murmur  Abdomen: Soft, non-tender; non-distended, BS present  Extremities: No peripheral edema, warm Skin: Normal turgor and texture; no rash Psych: Appropriate affect Neuro: Alert and oriented to person and place, no focal deficit     Vitals:   11/16/22 1454  BP: 130/82  Pulse: 74  Temp: 98.6 F (37 C)  TempSrc: Oral  SpO2: 100%  Weight: 161 lb (73 kg)  Height: '5\' 5"'$  (1.651 m)   100% on  RA BMI Readings from Last 3 Encounters:  11/16/22 26.79 kg/m  11/07/22 27.29 kg/m  10/31/22 26.28 kg/m   Wt Readings from  Last 3 Encounters:  11/16/22 161 lb (73 kg)  11/07/22 164 lb (74.4 kg)  10/31/22 162 lb 12.8 oz (73.8 kg)     CBC    Component Value Date/Time   WBC 6.3 10/31/2022 1020   RBC 5.72 (H) 10/31/2022 1020   HGB 15.7 (H) 10/31/2022 1020   HCT 48.7 (H) 10/31/2022 1020   PLT 296.0 10/31/2022 1020   MCV 85.2 10/31/2022 1020   MCH 25.7 (L) 02/17/2011 0140   MCHC 32.2 10/31/2022 1020   RDW 13.4 10/31/2022 1020   Bicarb 19    Chest Imaging: CXR 06/28/22 OSH report normal  Pulmonary Functions Testing Results:     No data to display              Assessment & Plan:   # DOE # Chronic cough Possibilities of covid-19 related small airways disease/reactive airways, covid-19 or occupation related ILD though OSH CXR was normal, deconditioning, long covid. Though she has mild metabolic acidosis from diamox doubt the effort from her compensatory respiratory alkalosis is resulting in sensation of dyspnea.    Plan: - x ray today - try omeprazole 40 mg daily 30 minutes before breakfast everyday till next visit - albuterol 1-2 puffs as needed and can try 5- 20 minutes before exercise to see if it helps - breathing tests in 3 months on same day as next clinic visit     Maryjane Hurter, MD [facility] 11/16/2022 3:06 PM

## 2022-11-16 ENCOUNTER — Ambulatory Visit (INDEPENDENT_AMBULATORY_CARE_PROVIDER_SITE_OTHER): Payer: 59

## 2022-11-16 ENCOUNTER — Ambulatory Visit: Payer: 59 | Admitting: Student

## 2022-11-16 ENCOUNTER — Ambulatory Visit
Admission: RE | Admit: 2022-11-16 | Discharge: 2022-11-16 | Disposition: A | Discharge status: Home / Self Care | Payer: 59 | Source: Ambulatory Visit | Attending: Obstetrics and Gynecology | Admitting: Obstetrics and Gynecology

## 2022-11-16 ENCOUNTER — Encounter: Payer: Self-pay | Admitting: Student

## 2022-11-16 VITALS — BP 130/82 | HR 74 | Temp 98.6°F | Ht 65.0 in | Wt 161.0 lb

## 2022-11-16 DIAGNOSIS — R053 Chronic cough: Secondary | ICD-10-CM | POA: Diagnosis not present

## 2022-11-16 DIAGNOSIS — R102 Pelvic and perineal pain: Secondary | ICD-10-CM

## 2022-11-16 DIAGNOSIS — R059 Cough, unspecified: Secondary | ICD-10-CM | POA: Diagnosis not present

## 2022-11-16 DIAGNOSIS — R0609 Other forms of dyspnea: Secondary | ICD-10-CM | POA: Diagnosis not present

## 2022-11-16 DIAGNOSIS — R0600 Dyspnea, unspecified: Secondary | ICD-10-CM | POA: Diagnosis not present

## 2022-11-16 DIAGNOSIS — K5900 Constipation, unspecified: Secondary | ICD-10-CM | POA: Diagnosis not present

## 2022-11-16 MED ORDER — OMEPRAZOLE 40 MG PO CPDR: 40.0000 mg | DELAYED_RELEASE_CAPSULE | Freq: Every day | ORAL | 0 refills | Status: DC

## 2022-11-16 MED ORDER — IOPAMIDOL (ISOVUE-300) INJECTION 61%
100.0000 mL | Freq: Once | INTRAVENOUS | Status: AC | PRN
  Administered 2022-11-16: 100 mL via INTRAVENOUS

## 2022-11-16 NOTE — Patient Instructions (Signed)
-   x ray today - try omeprazole 40 mg daily 30 minutes before breakfast everyday till next visit - albuterol 1-2 puffs as needed and can try 5- 20 minutes before exercise to see if it helps - breathing tests in 3 months on same day as next clinic visit

## 2022-11-19 ENCOUNTER — Other Ambulatory Visit: Payer: Self-pay | Admitting: Family Medicine

## 2022-11-19 DIAGNOSIS — U099 Post covid-19 condition, unspecified: Secondary | ICD-10-CM

## 2022-11-28 ENCOUNTER — Ambulatory Visit (INDEPENDENT_AMBULATORY_CARE_PROVIDER_SITE_OTHER): Payer: 59 | Admitting: Family Medicine

## 2022-11-28 ENCOUNTER — Encounter: Payer: Self-pay | Admitting: Family Medicine

## 2022-11-28 VITALS — BP 114/74 | HR 70 | Temp 97.8°F | Wt 162.2 lb

## 2022-11-28 DIAGNOSIS — R0600 Dyspnea, unspecified: Secondary | ICD-10-CM | POA: Diagnosis not present

## 2022-11-28 DIAGNOSIS — E559 Vitamin D deficiency, unspecified: Secondary | ICD-10-CM | POA: Diagnosis not present

## 2022-11-28 DIAGNOSIS — E785 Hyperlipidemia, unspecified: Secondary | ICD-10-CM | POA: Diagnosis not present

## 2022-11-28 DIAGNOSIS — D219 Benign neoplasm of connective and other soft tissue, unspecified: Secondary | ICD-10-CM | POA: Diagnosis not present

## 2022-11-28 DIAGNOSIS — Z3009 Encounter for other general counseling and advice on contraception: Secondary | ICD-10-CM | POA: Insufficient documentation

## 2022-11-28 DIAGNOSIS — E538 Deficiency of other specified B group vitamins: Secondary | ICD-10-CM | POA: Diagnosis not present

## 2022-11-28 DIAGNOSIS — K219 Gastro-esophageal reflux disease without esophagitis: Secondary | ICD-10-CM | POA: Insufficient documentation

## 2022-11-28 LAB — CBC WITH DIFFERENTIAL/PLATELET
Basophils Absolute: 0.1 10*3/uL (ref 0.0–0.1)
Basophils Relative: 0.7 % (ref 0.0–3.0)
Eosinophils Absolute: 0 10*3/uL (ref 0.0–0.7)
Eosinophils Relative: 0.5 % (ref 0.0–5.0)
HCT: 47.1 % — ABNORMAL HIGH (ref 36.0–46.0)
Hemoglobin: 15.2 g/dL — ABNORMAL HIGH (ref 12.0–15.0)
Lymphocytes Relative: 28.7 % (ref 12.0–46.0)
Lymphs Abs: 2 10*3/uL (ref 0.7–4.0)
MCHC: 32.3 g/dL (ref 30.0–36.0)
MCV: 84.8 fl (ref 78.0–100.0)
Monocytes Absolute: 0.4 10*3/uL (ref 0.1–1.0)
Monocytes Relative: 5.6 % (ref 3.0–12.0)
Neutro Abs: 4.6 10*3/uL (ref 1.4–7.7)
Neutrophils Relative %: 64.5 % (ref 43.0–77.0)
Platelets: 344 10*3/uL (ref 150.0–400.0)
RBC: 5.55 Mil/uL — ABNORMAL HIGH (ref 3.87–5.11)
RDW: 13.2 % (ref 11.5–15.5)
WBC: 7.1 10*3/uL (ref 4.0–10.5)

## 2022-11-28 LAB — HEPATIC FUNCTION PANEL
ALT: 21 U/L (ref 0–35)
AST: 16 U/L (ref 0–37)
Albumin: 4.2 g/dL (ref 3.5–5.2)
Alkaline Phosphatase: 51 U/L (ref 39–117)
Bilirubin, Direct: 0.1 mg/dL (ref 0.0–0.3)
Total Bilirubin: 0.4 mg/dL (ref 0.2–1.2)
Total Protein: 7 g/dL (ref 6.0–8.3)

## 2022-11-28 LAB — B12 AND FOLATE PANEL
Folate: 9.6 ng/mL (ref >5.9)
Vitamin B-12: 445 pg/mL (ref 211–911)

## 2022-11-28 LAB — LIPID PANEL
Cholesterol: 148 mg/dL (ref 0–200)
HDL: 48.1 mg/dL (ref >39.00)
LDL Cholesterol: 78 mg/dL (ref 0–99)
NonHDL: 99.43
Total CHOL/HDL Ratio: 3
Triglycerides: 107 mg/dL (ref 0.0–149.0)
VLDL: 21.4 mg/dL (ref 0.0–40.0)

## 2022-11-28 MED ORDER — MULTI-VITAMIN/MINERALS PO TABS: 1.0000 | ORAL_TABLET | Freq: Every day | ORAL | Status: DC

## 2022-11-28 MED ORDER — VITAMIN B-12 1000 MCG PO TABS: 1000.0000 ug | ORAL_TABLET | Freq: Every day | ORAL | Status: DC

## 2022-11-28 MED ORDER — VITAMIN D3 50 MCG (2000 UT) PO CAPS: 2000.0000 [IU] | ORAL_CAPSULE | Freq: Every day | ORAL | Status: DC

## 2022-11-28 NOTE — Assessment & Plan Note (Signed)
Continued monitoring of symptoms. If symptoms do not manage properly with conservative measures, further gynecological evaluation for potential surgical intervention may be considered.

## 2022-11-28 NOTE — Assessment & Plan Note (Signed)
Continue omeprazole as it appears to be providing relief to symptoms of bloating and possible contribution to dyspnea.

## 2022-11-28 NOTE — Progress Notes (Signed)
Assessment/Plan:   Problem List Items Addressed This Visit       Digestive   Gastroesophageal reflux disease    Continue omeprazole as it appears to be providing relief to symptoms of bloating and possible contribution to dyspnea.        Other   Vitamin D deficiency - Primary    Continue cholecalciferol supplementation as prescribed. Advise increased sun exposure and incorporation of vitamin D-rich foods into the diet.      Relevant Medications   Cholecalciferol (VITAMIN D3) 50 MCG (2000 UT) CAPS   Multiple Vitamins-Minerals (MULTIVITAMIN WITH MINERALS) tablet   Other Relevant Orders   Vitamin D 1,25 dihydroxy   Hyperlipidemia    Monitor response to rosuvastatin therapy and consider lipid panel follow-up to adjust medication as needed. Encourage a diet low in saturated fats and cholesterol.      Relevant Orders   Lipid Profile   Hepatic function panel   Birth control counseling    Discuss nonhormonal birth control options such as copper IUD, barrier methods, or potential surgical options given the patient's concerns about hormonal effects on IIH.      Fibroid    Continued monitoring of symptoms. If symptoms do not manage properly with conservative measures, further gynecological evaluation for potential surgical intervention may be considered.      Dyspnea    Continue use of albuterol as needed for shortness of breath. A follow-up with pulmonology for pulmonary function testing to further assess the need for additional respiratory therapy or investigations.      B12 deficiency   Relevant Medications   cyanocobalamin (VITAMIN B12) 1000 MCG tablet   Multiple Vitamins-Minerals (MULTIVITAMIN WITH MINERALS) tablet   Other Relevant Orders   B12 and Folate Panel   CBC w/Diff    There are no discontinued medications.    Subjective:  HPI: Encounter date: 11/28/2022  Caroline Charles is a 37 y.o. female who has COVID-19 long hauler manifesting chronic dyspnea; IIH  (idiopathic intracranial hypertension); Vitamin D deficiency; Hyperlipidemia; Birth control counseling; Fibroid; Gastroesophageal reflux disease; Dyspnea; and B12 deficiency on their problem list..   She  has a past medical history of IIH (idiopathic intracranial hypertension) and Papilledema..   Chief Complaint: Follow-up (Fasting, shortness of breath has improved, inquiries about which vitamins to take), fibroid management, and medication concerns relevant to idiopathic intracranial hypertension (IIH) and the use of birth control.  History of Present Illness:  Idiopathic Intracranial Hypertension (IIH): The patient has a history of IIH.  Patient previously on hormonal IUD which was felt to exacerbate symptoms.  Patient reports that she has had improvement in headaches after removal and has been able to even reduce her dosage of Diamox as recommended by her neurologist.    Birth Control and Fibroids: Patient with uterine fibroid that is felt caused left lower quadrant abdominal pain without menorrhagia.  The patient heard from her OB that hormonal therapies such as birth control pills, patches, or rings might help manage her fibroids, but there is concern about potential hormone-induced pressure increases relevant to her IIH (please see above).  Patient request input regarding alternative forms of birth control.  Shortness of Breath: Patient recently saw pulmonology for ongoing shortness of breath.  The patient uses albuterol (Ventolin HFA) as needed for wheezing or shortness of breath and saw improvement after being prescribed omeprazole (Prilosec) '40mg'$  daily for presumed gastroesophageal reflux, which may be contributing to her dyspnea.  Hyperlipidemia: High cholesterol levels prompted the initiation of rosuvastatin (  Crestor) '10mg'$  daily, alongside lifestyle changes to improve her lipid profile. Her LDL levels were found to be 191 prior to medication.  Review of Systems: Cardiology: No evidence of  chest pain or palpitations. Respiratory: Improvement in shortness of breath with medication. Chronic cough and dyspnea on exertion noted but improving. Gastrointestinal: Chronic mild left lower abdominal pain.  No nausea, vomiting, constipation or diarrhea. Gynecological: No excessive menstrual bleeding.  No past surgical history on file.  Outpatient Medications Prior to Visit  Medication Sig Dispense Refill   acetaZOLAMIDE (DIAMOX) 250 MG tablet Take 1 tablet (250 mg total) by mouth 2 (two) times daily. 180 tablet 0   albuterol (VENTOLIN HFA) 108 (90 Base) MCG/ACT inhaler TAKE 2 PUFFS BY MOUTH EVERY 6 HOURS AS NEEDED FOR WHEEZE OR SHORTNESS OF BREATH 18 each 0   omeprazole (PRILOSEC) 40 MG capsule Take 1 capsule (40 mg total) by mouth daily. 90 capsule 0   penicillin v potassium (VEETID) 500 MG tablet Take by mouth.     rosuvastatin (CRESTOR) 10 MG tablet Take 1 tablet (10 mg total) by mouth daily. 90 tablet 3   Vitamin D, Ergocalciferol, (DRISDOL) 1.25 MG (50000 UNIT) CAPS capsule Take 1 capsule (50,000 Units total) by mouth every 7 (seven) days. (Patient not taking: Reported on 11/28/2022) 5 capsule 0   No facility-administered medications prior to visit.    Family History  Problem Relation Age of Onset   Hypertension Mother    Diabetes Mother    Hyperlipidemia Mother    Ovarian cysts Mother     Social History   Socioeconomic History   Marital status: Single    Spouse name: Not on file   Number of children: Not on file   Years of education: Not on file   Highest education level: Not on file  Occupational History   Not on file  Tobacco Use   Smoking status: Never    Passive exposure: Never   Smokeless tobacco: Not on file  Vaping Use   Vaping Use: Never used  Substance and Sexual Activity   Alcohol use: Not on file   Drug use: Not on file   Sexual activity: Not on file  Other Topics Concern   Not on file  Social History Narrative   Not on file   Social  Determinants of Health   Financial Resource Strain: Not on file  Food Insecurity: Not on file  Transportation Needs: Not on file  Physical Activity: Not on file  Stress: Not on file  Social Connections: Not on file  Intimate Partner Violence: Not on file                                                                                                 Objective:  Physical Exam: BP 114/74 (BP Location: Left Arm, Patient Position: Sitting, Cuff Size: Large)   Pulse 70   Temp 97.8 F (36.6 C) (Temporal)   Wt 162 lb 3.2 oz (73.6 kg)   LMP 11/14/2022   SpO2 99%   BMI 26.99 kg/m    Physical Exam Constitutional:  General: She is not in acute distress.    Appearance: Normal appearance. She is not ill-appearing or toxic-appearing.  HENT:     Head: Normocephalic.     Right Ear: Tympanic membrane, ear canal and external ear normal. There is no impacted cerumen.     Left Ear: Tympanic membrane, ear canal and external ear normal. There is no impacted cerumen.     Nose: Nose normal. No congestion.     Mouth/Throat:     Mouth: Mucous membranes are moist.     Pharynx: Oropharynx is clear. No oropharyngeal exudate.  Eyes:     General: No scleral icterus.       Right eye: No discharge.        Left eye: No discharge.     Conjunctiva/sclera: Conjunctivae normal.     Pupils: Pupils are equal, round, and reactive to light.  Cardiovascular:     Rate and Rhythm: Normal rate and regular rhythm.     Pulses: Normal pulses.     Heart sounds: Normal heart sounds.  Pulmonary:     Effort: Pulmonary effort is normal. No respiratory distress.     Breath sounds: Normal breath sounds.  Abdominal:     General: Abdomen is flat.     Tenderness: There is abdominal tenderness in the left lower quadrant. There is no guarding or rebound.  Musculoskeletal:        General: Normal range of motion.     Cervical back: Normal range of motion.  Lymphadenopathy:     Cervical: No cervical adenopathy.   Skin:    General: Skin is warm and dry.     Findings: No rash.  Neurological:     General: No focal deficit present.     Mental Status: She is alert and oriented to person, place, and time. Mental status is at baseline.  Psychiatric:        Mood and Affect: Mood normal.        Behavior: Behavior normal.        Thought Content: Thought content normal.        Judgment: Judgment normal.    Labs: Lipid Profile: High total cholesterol and LDL. CBC: Mildly elevated hemoglobin and hematocrit, possible hemoconcentration. CMP: Normal liver and kidney function. Vitamin D Level: Low, indicating deficiency. Vitamin B12: Within normal range.  Imaging: DG Chest 2 View: No active cardiopulmonary disease. CT Abdomen Pelvis w/ Contrast: No acute findings, probable small fundal fibroid.  Recent Results (from the past 2160 hour(s))  Lipid Profile     Status: Abnormal   Collection Time: 10/31/22 10:20 AM  Result Value Ref Range   Cholesterol 274 (H) 0 - 200 mg/dL    Comment: ATP III Classification       Desirable:  < 200 mg/dL               Borderline High:  200 - 239 mg/dL          High:  > = 240 mg/dL   Triglycerides 116.0 0.0 - 149.0 mg/dL    Comment: Normal:  <150 mg/dLBorderline High:  150 - 199 mg/dL   HDL 59.60 >39.00 mg/dL   VLDL 23.2 0.0 - 40.0 mg/dL   LDL Cholesterol 191 (H) 0 - 99 mg/dL   Total CHOL/HDL Ratio 5     Comment:                Men          Women1/2 Average Risk  3.4          3.3Average Risk          5.0          4.42X Average Risk          9.6          7.13X Average Risk          15.0          11.0                       NonHDL 214.38     Comment: NOTE:  Non-HDL goal should be 30 mg/dL higher than patient's LDL goal (i.e. LDL goal of < 70 mg/dL, would have non-HDL goal of < 100 mg/dL)  Urinalysis, Routine w reflex microscopic     Status: Abnormal   Collection Time: 10/31/22 10:20 AM  Result Value Ref Range   Color, Urine YELLOW Yellow;Lt. Yellow;Straw;Dark  Yellow;Amber;Green;Red;Brown   APPearance CLEAR Clear;Turbid;Slightly Cloudy;Cloudy   Specific Gravity, Urine 1.025 1.000 - 1.030   pH 6.0 5.0 - 8.0   Total Protein, Urine NEGATIVE Negative   Urine Glucose NEGATIVE Negative   Ketones, ur NEGATIVE Negative   Bilirubin Urine NEGATIVE Negative   Hgb urine dipstick NEGATIVE Negative   Urobilinogen, UA 0.2 0.0 - 1.0   Leukocytes,Ua NEGATIVE Negative   Nitrite NEGATIVE Negative   WBC, UA 0-2/hpf 0-2/hpf   RBC / HPF none seen 0-2/hpf   Mucus, UA Presence of (A) None   Squamous Epithelial / HPF Rare(0-4/hpf) Rare(0-4/hpf)  C-reactive protein     Status: None   Collection Time: 10/31/22 10:20 AM  Result Value Ref Range   CRP <1.0 0.5 - 20.0 mg/dL  CBC     Status: Abnormal   Collection Time: 10/31/22 10:20 AM  Result Value Ref Range   WBC 6.3 4.0 - 10.5 K/uL   RBC 5.72 (H) 3.87 - 5.11 Mil/uL   Platelets 296.0 150.0 - 400.0 K/uL   Hemoglobin 15.7 (H) 12.0 - 15.0 g/dL   HCT 48.7 (H) 36.0 - 46.0 %   MCV 85.2 78.0 - 100.0 fl   MCHC 32.2 30.0 - 36.0 g/dL   RDW 13.4 11.5 - 15.5 %  Comprehensive metabolic panel     Status: None   Collection Time: 10/31/22 10:20 AM  Result Value Ref Range   Sodium 136 135 - 145 mEq/L   Potassium 3.7 3.5 - 5.1 mEq/L   Chloride 107 96 - 112 mEq/L   CO2 19 19 - 32 mEq/L   Glucose, Bld 83 70 - 99 mg/dL   BUN 13 6 - 23 mg/dL   Creatinine, Ser 0.83 0.40 - 1.20 mg/dL   Total Bilirubin 0.5 0.2 - 1.2 mg/dL   Alkaline Phosphatase 60 39 - 117 U/L   AST 15 0 - 37 U/L   ALT 18 0 - 35 U/L   Total Protein 7.2 6.0 - 8.3 g/dL   Albumin 4.5 3.5 - 5.2 g/dL   GFR 90.67 >60.00 mL/min    Comment: Calculated using the CKD-EPI Creatinine Equation (2021)   Calcium 9.1 8.4 - 10.5 mg/dL  Ferritin     Status: None   Collection Time: 10/31/22 10:20 AM  Result Value Ref Range   Ferritin 145.4 10.0 - 291.0 ng/mL  Sedimentation rate     Status: None   Collection Time: 10/31/22 10:20 AM  Result Value Ref Range   Sed Rate 9 0 -  20 mm/hr  TSH     Status: None   Collection Time: 10/31/22 10:20 AM  Result Value Ref Range   TSH 2.90 0.35 - 5.50 uIU/mL  Vit D  25 hydroxy (rtn osteoporosis monitoring)     Status: Abnormal   Collection Time: 10/31/22 10:20 AM  Result Value Ref Range   VITD 11.90 (L) 30.00 - 100.00 ng/mL  Vitamin B12     Status: None   Collection Time: 10/31/22 10:20 AM  Result Value Ref Range   Vitamin B-12 370 211 - 911 pg/mL   DG Chest 2 View  Result Date: 11/17/2022 CLINICAL DATA:  Dyspnea on exertion.  Cough. EXAM: CHEST - 2 VIEW COMPARISON:  None Available. FINDINGS: The heart size and mediastinal contours are within normal limits. Both lungs are clear. The visualized skeletal structures are unremarkable. IMPRESSION: No active cardiopulmonary disease. Electronically Signed   By: Dorise Bullion III M.D.   On: 11/17/2022 17:59   CT ABDOMEN PELVIS W CONTRAST  Result Date: 11/17/2022 CLINICAL DATA:  Pelvic and perineal pain. Constipation for 2 months. EXAM: CT ABDOMEN AND PELVIS WITH CONTRAST TECHNIQUE: Multidetector CT imaging of the abdomen and pelvis was performed using the standard protocol following bolus administration of intravenous contrast. RADIATION DOSE REDUCTION: This exam was performed according to the departmental dose-optimization program which includes automated exposure control, adjustment of the mA and/or kV according to patient size and/or use of iterative reconstruction technique. CONTRAST:  176m ISOVUE-300 IOPAMIDOL (ISOVUE-300) INJECTION 61% COMPARISON:  None Available. FINDINGS: Lower chest: Unremarkable. Hepatobiliary: No suspicious focal abnormality within the liver parenchyma. Gallbladder is decompressed without discernible stone disease. No intrahepatic or extrahepatic biliary dilation. Pancreas: No focal mass lesion. No dilatation of the main duct. No intraparenchymal cyst. No peripancreatic edema. Spleen: No splenomegaly. No focal mass lesion. Adrenals/Urinary Tract: No adrenal  nodule or mass. Kidneys unremarkable. No evidence for hydroureter. The urinary bladder appears normal for the degree of distention. Stomach/Bowel: Stomach is unremarkable. No gastric wall thickening. No evidence of outlet obstruction. Duodenum is normally positioned as is the ligament of Treitz. No small bowel wall thickening. No small bowel dilatation. The terminal ileum is normal. The appendix is normal. No gross colonic mass. No colonic wall thickening. Vascular/Lymphatic: No abdominal aortic aneurysm. No abdominal aortic atherosclerotic calcification. There is no gastrohepatic or hepatoduodenal ligament lymphadenopathy. No retroperitoneal or mesenteric lymphadenopathy. No pelvic sidewall lymphadenopathy. Reproductive: Probable small fundal fibroid. Cervix is ill-defined and prominent with circumferential hypo attenuation towards the external os (axial 78/2). Cervix is not well evaluated by CT imaging in this appearance may simply reflect multiple nabothian cysts. Other: No intraperitoneal free fluid. Musculoskeletal: No worrisome lytic or sclerotic osseous abnormality. IMPRESSION: 1. No acute findings in the abdomen or pelvis. Specifically, no findings to explain the patient's history of pelvic and perineal pain. 2. Cervix is ill-defined and prominent with circumferential hypo attenuation towards the external os. However, cervix is not well evaluated by CT imaging in appearance may simply reflect multiple nabothian cysts. Correlation with pelvic ultrasound and Pap smear suggested. Electronically Signed   By: EMisty StanleyM.D.   On: 11/17/2022 06:38     AAlesia Banda MD, MS

## 2022-11-28 NOTE — Assessment & Plan Note (Signed)
Continue cholecalciferol supplementation as prescribed. Advise increased sun exposure and incorporation of vitamin D-rich foods into the diet.

## 2022-11-28 NOTE — Assessment & Plan Note (Signed)
Discuss nonhormonal birth control options such as copper IUD, barrier methods, or potential surgical options given the patient's concerns about hormonal effects on IIH.

## 2022-11-28 NOTE — Assessment & Plan Note (Signed)
Continue use of albuterol as needed for shortness of breath. A follow-up with pulmonology for pulmonary function testing to further assess the need for additional respiratory therapy or investigations.

## 2022-11-28 NOTE — Assessment & Plan Note (Signed)
Monitor response to rosuvastatin therapy and consider lipid panel follow-up to adjust medication as needed. Encourage a diet low in saturated fats and cholesterol.

## 2022-11-28 NOTE — Patient Instructions (Addendum)
Continue with current medications and vitamin D/B12 supplements as discussed.  We are checking cholesterol levels labs as discussed.  We will call you with results.

## 2022-12-02 LAB — VITAMIN D 1,25 DIHYDROXY
Vitamin D 1, 25 (OH)2 Total: 66 pg/mL (ref 18–72)
Vitamin D2 1, 25 (OH)2: 49 pg/mL
Vitamin D3 1, 25 (OH)2: 17 pg/mL

## 2022-12-09 ENCOUNTER — Other Ambulatory Visit: Payer: Self-pay | Admitting: Family Medicine

## 2022-12-09 DIAGNOSIS — R0609 Other forms of dyspnea: Secondary | ICD-10-CM

## 2023-01-03 DIAGNOSIS — Z79899 Other long term (current) drug therapy: Secondary | ICD-10-CM | POA: Diagnosis not present

## 2023-01-11 ENCOUNTER — Other Ambulatory Visit: Payer: Self-pay | Admitting: Family

## 2023-01-11 ENCOUNTER — Telehealth: Payer: Self-pay | Admitting: Neurology

## 2023-01-11 DIAGNOSIS — R0609 Other forms of dyspnea: Secondary | ICD-10-CM

## 2023-01-11 NOTE — Telephone Encounter (Signed)
Tried calling patient, unable to connect. Mychart message sent to patient.

## 2023-01-11 NOTE — Telephone Encounter (Signed)
Received ophthalmology notes.  No evidence of papilledema.  At this time, I would like to discontinue the acetazolamide.  However, I would need for her to follow up with the ophthalmologist again in about 8 weeks to assess for any recurrence of swelling behind the eyes.

## 2023-01-24 ENCOUNTER — Other Ambulatory Visit: Payer: Self-pay | Admitting: Neurology

## 2023-01-30 ENCOUNTER — Other Ambulatory Visit: Payer: Self-pay | Admitting: Student

## 2023-02-05 DIAGNOSIS — H04123 Dry eye syndrome of bilateral lacrimal glands: Secondary | ICD-10-CM | POA: Diagnosis not present

## 2023-03-06 ENCOUNTER — Ambulatory Visit (INDEPENDENT_AMBULATORY_CARE_PROVIDER_SITE_OTHER): Payer: 59 | Admitting: Family Medicine

## 2023-03-06 ENCOUNTER — Encounter: Payer: Self-pay | Admitting: Family Medicine

## 2023-03-06 VITALS — BP 124/80 | HR 78 | Temp 98.4°F | Ht 65.0 in | Wt 164.0 lb

## 2023-03-06 DIAGNOSIS — S29011A Strain of muscle and tendon of front wall of thorax, initial encounter: Secondary | ICD-10-CM | POA: Diagnosis not present

## 2023-03-06 NOTE — Progress Notes (Signed)
Established Patient Office Visit   Subjective:  [name], female    DOB: [birth year]  Age: 37 y.o. MRN: [id number]  Chief Complaint  Patient presents with   Breast Pain    Sharpe pain in left breast come and go x 4 days very painful today.     HPI Encounter Diagnoses  Name Primary?   Muscle strain of chest wall, initial encounter Yes   Presents with 3 to 4-day history of intermittent sharp pains in her left lateral chest area.  Denies injury.  No trauma.  No heavy lifting.  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  She denies difficulty breathing nausea or or shortness of breath.  There is no exertional component.  No cough   Review of Systems  Constitutional: Negative.  Negative for diaphoresis.  HENT: Negative.    Eyes:  Negative for blurred vision, discharge and redness.  Respiratory: Negative.  Negative for cough and sputum production.   Cardiovascular:  Positive for chest pain. Negative for palpitations, claudication and leg swelling.  Gastrointestinal:  Negative for abdominal pain, nausea and vomiting.  Genitourinary: Negative.   Musculoskeletal: Negative.  Negative for myalgias.  Skin:  Negative for rash.  Neurological:  Negative for tingling, loss of consciousness and weakness.  Endo/Heme/Allergies:  Negative for polydipsia.     Current Outpatient Medications:    acetaZOLAMIDE (DIAMOX) 250 MG tablet, TAKE 1 TABLET BY MOUTH TWICE A DAY, Disp: 60 tablet, Rfl: 2   albuterol (VENTOLIN HFA) 108 (90 Base) MCG/ACT inhaler, TAKE 2 PUFFS BY MOUTH EVERY 6 HOURS AS NEEDED FOR WHEEZE OR SHORTNESS OF BREATH, Disp: 18 each, Rfl: 0   Multiple Vitamins-Minerals (MULTIVITAMIN WITH MINERALS) tablet, Take 1 tablet by mouth daily., Disp: , Rfl:    rosuvastatin (CRESTOR) 10 MG tablet, Take 1 tablet (10 mg total) by mouth daily., Disp: 90 tablet, Rfl: 3   Cholecalciferol (VITAMIN D3) 50 MCG (2000 UT) CAPS, Take 1 capsule (2,000  Units total) by mouth daily. (Patient not taking: Reported on 03/06/2023), Disp: , Rfl:    cyanocobalamin (VITAMIN B12) 1000 MCG tablet, Take 1 tablet (1,000 mcg total) by mouth daily. (Patient not taking: Reported on 03/06/2023), Disp: , Rfl:    omeprazole (PRILOSEC) 40 MG capsule, TAKE 1 CAPSULE (40 MG TOTAL) BY MOUTH DAILY. (Patient not taking: Reported on 03/06/2023), Disp: 30 capsule, Rfl: 2   Vitamin D, Ergocalciferol, (DRISDOL) 1.25 MG (50000 UNIT) CAPS capsule, Take 1 capsule (50,000 Units total) by mouth every 7 (seven) days. (Patient not taking: Reported on 11/28/2022), Disp: 5 capsule, Rfl: 0   Objective:     BP 124/80 (BP Location: Right Arm, Patient Position: Sitting, Cuff Size: Normal)   Pulse 78   Temp 98.4 F (36.9 C) (Temporal)   Ht 5\' 5"  (1.651 m)   Wt 164 lb (74.4 kg)   SpO2 99%   BMI 27.29 kg/m    Physical Exam Constitutional:      General: She is not in acute distress.    Appearance: Normal appearance. She is not ill-appearing, toxic-appearing or diaphoretic.  HENT:     Head: Normocephalic and atraumatic.     Right Ear: External ear normal.     Left Ear: External ear normal.     Mouth/Throat:     Mouth: Mucous membranes are moist.     Pharynx: Oropharynx is clear. No oropharyngeal exudate or posterior oropharyngeal erythema.  Eyes:  General: No scleral icterus.       Right eye: No discharge.        Left eye: No discharge.     Extraocular Movements: Extraocular movements intact.     Conjunctiva/sclera: Conjunctivae normal.     Pupils: Pupils are equal, round, and reactive to light.  Cardiovascular:     Rate and Rhythm: Normal rate and regular rhythm.  Pulmonary:     Effort: Pulmonary effort is normal. No respiratory distress.     Breath sounds: Normal breath sounds.  Chest:    Musculoskeletal:     Cervical back: No rigidity or tenderness.  Skin:    General: Skin is warm and dry.  Neurological:     Mental Status: She is alert and oriented to person,  place, and time.  Psychiatric:        Mood and Affect: Mood normal.        Behavior: Behavior normal.      No results found for any visits on 03/06/23.    The ASCVD Risk score (Arnett DK, et al., 2019) failed to calculate for the following reasons:   The 2019 ASCVD risk score is only valid for ages 45 to 44    Assessment & Plan:   Muscle strain of chest wall, initial encounter -     DG Ribs Unilateral Left; Future    Return if symptoms worsen or fail to improve.  Will use ibuprofen for pain.  Information was given on chest wall pain.  Follow-up in 2 to 3 weeks if not improving.  Was tenderness to palpation will send for left rib films.  Mliss Sax, MD

## 2023-03-07 ENCOUNTER — Ambulatory Visit (INDEPENDENT_AMBULATORY_CARE_PROVIDER_SITE_OTHER)
Admission: RE | Admit: 2023-03-07 | Discharge: 2023-03-07 | Disposition: A | Discharge status: Home / Self Care | Payer: 59 | Source: Ambulatory Visit | Attending: Family Medicine | Admitting: Family Medicine

## 2023-03-07 DIAGNOSIS — R0789 Other chest pain: Secondary | ICD-10-CM | POA: Diagnosis not present

## 2023-03-07 DIAGNOSIS — S29011A Strain of muscle and tendon of front wall of thorax, initial encounter: Secondary | ICD-10-CM | POA: Diagnosis not present

## 2023-03-13 ENCOUNTER — Encounter: Payer: Self-pay | Admitting: Nurse Practitioner

## 2023-03-13 ENCOUNTER — Ambulatory Visit (INDEPENDENT_AMBULATORY_CARE_PROVIDER_SITE_OTHER): Payer: 59 | Admitting: Nurse Practitioner

## 2023-03-13 VITALS — BP 120/84 | HR 73 | Temp 97.9°F | Resp 16 | Ht 65.0 in | Wt 162.0 lb

## 2023-03-13 DIAGNOSIS — K219 Gastro-esophageal reflux disease without esophagitis: Secondary | ICD-10-CM | POA: Diagnosis not present

## 2023-03-13 DIAGNOSIS — A048 Other specified bacterial intestinal infections: Secondary | ICD-10-CM | POA: Diagnosis not present

## 2023-03-13 MED ORDER — OMEPRAZOLE 20 MG PO CPDR
20.0000 mg | DELAYED_RELEASE_CAPSULE | Freq: Every day | ORAL | 0 refills | Status: DC
Start: 2023-03-13 — End: 2023-03-18

## 2023-03-13 NOTE — Patient Instructions (Addendum)
Stop albuterol Resume omeprazole 20mg  Schedule appointment for pulmonary function test. Go to lab

## 2023-03-13 NOTE — Progress Notes (Addendum)
Established Patient Visit  Patient: Caroline Charles   DOB: June 07, 1986   37 y.o. Female  MRN: [id number] Visit Date: 03/18/2023  Subjective:    Chief Complaint  Patient presents with   Chest Pain   HPI Gastroesophageal reflux disease Onset 3months ago, describes as chest fullness, SOB  and ABDOMEN bloating Worse in supine position. Symptoms resolved with omeprazole 40mg , but she stopped med 3weeks ago  She was also prescribed Albuterol for dyspnea 3months ago. She uses 2puffs BID with minimal relief.  Symptoms returned after discontinuation of omeprazole. Now noted elevated HR at rest for last 1week. Denies any palpitation or dizziness Admits to increased anxiety due to concern about possible cardiac etiology. 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  I suspect elevated HR is due to use of albuterol, so advised to stop inhaler. Advised to contact [facility] for PFT appointment. Resume omeprazole 20mg  daily Check H. Pylori antigen Provided reassurance about low risk of cardiac event. F/up with pcp in 1-2weeks  Reviewed medical, surgical, and social history today  Medications: Outpatient Medications Prior to Visit  Medication Sig   acetaZOLAMIDE (DIAMOX) 250 MG tablet TAKE 1 TABLET BY MOUTH TWICE A DAY   albuterol (VENTOLIN HFA) 108 (90 Base) MCG/ACT inhaler TAKE 2 PUFFS BY MOUTH EVERY 6 HOURS AS NEEDED FOR WHEEZE OR SHORTNESS OF BREATH   Multiple Vitamins-Minerals (MULTIVITAMIN WITH MINERALS) tablet Take 1 tablet by mouth daily.   rosuvastatin (CRESTOR) 10 MG tablet Take 1 tablet (10 mg total) by mouth daily.   [DISCONTINUED] Cholecalciferol (VITAMIN D3) 50 MCG (2000 UT) CAPS Take 1 capsule (2,000 Units total) by mouth daily. (Patient not taking: Reported on 03/06/2023)   [DISCONTINUED] cyanocobalamin (VITAMIN B12) 1000 MCG tablet Take 1 tablet (1,000 mcg total) by mouth  daily. (Patient not taking: Reported on 03/06/2023)   [DISCONTINUED] omeprazole (PRILOSEC) 40 MG capsule TAKE 1 CAPSULE (40 MG TOTAL) BY MOUTH DAILY. (Patient not taking: Reported on 03/06/2023)   [DISCONTINUED] Vitamin D, Ergocalciferol, (DRISDOL) 1.25 MG (50000 UNIT) CAPS capsule Take 1 capsule (50,000 Units total) by mouth every 7 (seven) days. (Patient not taking: Reported on 11/28/2022)   No facility-administered medications prior to visit.   Reviewed past medical and social history.   ROS per HPI above      Objective:  BP 120/84 (BP Location: Left Arm, Patient Position: Sitting, Cuff Size: Normal)   Pulse 73   Temp 97.9 F (36.6 C) (Temporal)   Resp 16   Ht 5\' 5"  (1.651 m)   Wt 162 lb (73.5 kg)   LMP 03/01/2023   SpO2 99%   BMI 26.96 kg/m      Physical Exam Cardiovascular:     Rate and Rhythm: Normal rate and regular rhythm.     Pulses: Normal pulses.     Heart sounds: Normal heart sounds.  Pulmonary:     Effort: Pulmonary effort is normal.     Breath sounds: Normal breath sounds.  Abdominal:     General: There is no distension.     Palpations: Abdomen is soft.     Tenderness: There is no abdominal tenderness.  Neurological:     Mental Status: She is alert and oriented to person, place, and time.  Psychiatric:        Attention and Perception: Attention normal.  Mood and Affect: Mood is anxious.        Speech: Speech normal.        Behavior: Behavior is cooperative.        Thought Content: Thought content normal.        Cognition and Memory: Cognition normal.     Results for orders placed or performed in visit on 03/13/23  H. pylori breath test  Result Value Ref Range   H. pylori Breath Test DETECTED (A) NOT DETECTED      Assessment & Plan:    Problem List Items Addressed This Visit       Digestive   Gastroesophageal reflux disease - Primary    Onset 3months ago, describes as chest fullness, SOB  and ABDOMEN bloating Worse in supine  position. Symptoms resolved with omeprazole 40mg , but she stopped med 3weeks ago  She was also prescribed Albuterol for dyspnea 3months ago. She uses 2puffs BID with minimal relief.  Symptoms returned after discontinuation of omeprazole. Now noted elevated HR at rest for last 1week. Denies any palpitation or dizziness Admits to increased anxiety due to concern about possible cardiac etiology. 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  I suspect elevated HR is due to use of albuterol, so advised to stop inhaler. Advised to contact [facility] for PFT appointment. Resume omeprazole 20mg  daily Check H. Pylori antigen Provided reassurance about low risk of cardiac event. F/up with pcp in 1-2weeks      Relevant Medications   clarithromycin (BIAXIN) 500 MG tablet   metroNIDAZOLE (FLAGYL) 500 MG tablet   omeprazole (PRILOSEC) 20 MG capsule   Return for f/up with pcp about generalixed Anxiety.     Alysia Penna, NP

## 2023-03-14 NOTE — Assessment & Plan Note (Addendum)
Onset 3months ago, describes as chest fullness, SOB  and ABDOMEN bloating Worse in supine position. Symptoms resolved with omeprazole 40mg , but she stopped med 3weeks ago  She was also prescribed Albuterol for dyspnea 3months ago. She uses 2puffs BID with minimal relief.  Symptoms returned after discontinuation of omeprazole. Now noted elevated HR at rest for last 1week. Denies any palpitation or dizziness Admits to increased anxiety due to concern about possible cardiac etiology. 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  I suspect elevated HR is due to use of albuterol, so advised to stop inhaler. Advised to contact [facility] for PFT appointment. Resume omeprazole 20mg  daily Check H. Pylori antigen Provided reassurance about low risk of cardiac event. F/up with pcp in 1-2weeks

## 2023-03-17 LAB — H. PYLORI BREATH TEST: H. pylori Breath Test: DETECTED — AB

## 2023-03-18 MED ORDER — METRONIDAZOLE 500 MG PO TABS
500.0000 mg | ORAL_TABLET | Freq: Two times a day (BID) | ORAL | 0 refills | Status: DC
Start: 2023-03-18 — End: 2023-04-10

## 2023-03-18 MED ORDER — CLARITHROMYCIN 500 MG PO TABS
500.0000 mg | ORAL_TABLET | Freq: Two times a day (BID) | ORAL | 0 refills | Status: DC
Start: 2023-03-18 — End: 2023-04-10

## 2023-03-18 MED ORDER — OMEPRAZOLE 20 MG PO CPDR
20.0000 mg | DELAYED_RELEASE_CAPSULE | Freq: Two times a day (BID) | ORAL | 0 refills | Status: DC
Start: 2023-03-18 — End: 2023-06-05

## 2023-03-18 NOTE — Addendum Note (Signed)
Addended by: Michaela Corner on: 03/18/2023 08:57 AM   Modules accepted: Orders

## 2023-03-27 ENCOUNTER — Telehealth: Payer: Self-pay | Admitting: Family Medicine

## 2023-03-27 NOTE — Telephone Encounter (Signed)
Pt was here 6/24 and prescribed an antibiotic.she is having some vaginal itching and discomfort and would like to know if something can be called in for this.

## 2023-03-28 ENCOUNTER — Encounter: Payer: Self-pay | Admitting: Family Medicine

## 2023-03-28 ENCOUNTER — Ambulatory Visit (INDEPENDENT_AMBULATORY_CARE_PROVIDER_SITE_OTHER): Payer: 59 | Admitting: Family Medicine

## 2023-03-28 VITALS — BP 122/74 | HR 84 | Temp 97.9°F | Wt 164.0 lb

## 2023-03-28 DIAGNOSIS — K219 Gastro-esophageal reflux disease without esophagitis: Secondary | ICD-10-CM

## 2023-03-28 DIAGNOSIS — G8929 Other chronic pain: Secondary | ICD-10-CM | POA: Insufficient documentation

## 2023-03-28 DIAGNOSIS — M25512 Pain in left shoulder: Secondary | ICD-10-CM | POA: Diagnosis not present

## 2023-03-28 DIAGNOSIS — N898 Other specified noninflammatory disorders of vagina: Secondary | ICD-10-CM | POA: Diagnosis not present

## 2023-03-28 DIAGNOSIS — R0600 Dyspnea, unspecified: Secondary | ICD-10-CM | POA: Diagnosis not present

## 2023-03-28 DIAGNOSIS — A048 Other specified bacterial intestinal infections: Secondary | ICD-10-CM | POA: Diagnosis not present

## 2023-03-28 MED ORDER — IBUPROFEN 800 MG PO TABS
800.0000 mg | ORAL_TABLET | Freq: Three times a day (TID) | ORAL | 0 refills | Status: DC | PRN
Start: 2023-03-28 — End: 2023-06-05

## 2023-03-28 MED ORDER — FLUCONAZOLE 150 MG PO TABS
150.00 mg | ORAL_TABLET | Freq: Once | ORAL | 0 refills | Status: AC
Start: 2023-03-28 — End: 2023-03-28

## 2023-03-28 NOTE — Assessment & Plan Note (Signed)
Chronic breathing difficulties: Currently under evaluation by Pulmonology. Albuterol use has provided some relief but may be exacerbating gastric reflux. -Continue follow-up with Pulmonology. -Use Albuterol as needed, not more than every 4-6 hours. -Consider impact of Albuterol on gastric reflux symptoms.

## 2023-03-28 NOTE — Assessment & Plan Note (Addendum)
H. Pylori infection: Currently on triple therapy (Omeprazole 20mg  BID, Metronidazole 500mg  BID, Clarithromycin 500mg  BID) with expected completion on 03/31/2023. Noted improvement in chest discomfort and breathing since starting therapy. -Complete current course of triple therapy. -Off Omeprazole for at least 2 weeks after completion of therapy. -Consider retesting for H. Pylori via stool or breath test after 2 weeks off Omeprazole. -Refer to Gastroenterology for further evaluation and possible endoscopy.

## 2023-03-28 NOTE — Assessment & Plan Note (Deleted)
Assessment and Plan    H. Pylori infection: Currently on triple therapy (Omeprazole 20mg  BID, Metronidazole 500mg  BID, Clarithromycin 500mg  BID) with expected completion on 03/31/2023. Noted improvement in chest discomfort and breathing since starting therapy. -Complete current course of triple therapy. -Off Omeprazole for at least 2 weeks after completion of therapy. -Consider retesting for H. Pylori via stool or breath test after 2 weeks off Omeprazole. -Refer to Gastroenterology for further evaluation and possible endoscopy.  Vaginal discharge: Likely secondary to antibiotic use for H. Pylori treatment. -Prescribe Fluconazole to be taken on 04/01/2023, after completion of antibiotics. -If symptoms persist, consider vaginal swab for further evaluation.  Left shoulder pain: Likely musculoskeletal in nature, possibly related to heavy lifting. Improvement noted over the past 3 weeks. -Prescribe Ibuprofen 800mg  as needed for pain. -Continue to monitor symptoms.  Chronic breathing difficulties: Currently under evaluation by Pulmonology. Albuterol use has provided some relief but may be exacerbating gastric reflux. -Continue follow-up with Pulmonology. -Use Albuterol as needed, not more than every 4-6 hours. -Consider impact of Albuterol on gastric reflux symptoms.

## 2023-03-28 NOTE — Assessment & Plan Note (Signed)
Likely musculoskeletal in nature, possibly related to heavy lifting. Improvement noted over the past 3 weeks. -Prescribe Ibuprofen 800mg  as needed for pain. -Continue to monitor symptoms.

## 2023-03-28 NOTE — Patient Instructions (Signed)
VISIT SUMMARY:  During your visit, we discussed your ongoing health concerns, including shoulder pain, anxiety, and vaginal discharge following treatment for H. Pylori. We also talked about your persistent breathing difficulties. We have made some changes to your treatment plan and have outlined some next steps for each of these issues.  YOUR PLAN:  -H. PYLORI INFECTION: You are currently on a three-drug treatment for H. Pylori, a type of bacteria that can cause stomach ulcers and heartburn. You should complete this treatment as planned. After finishing the treatment, you should stop taking Omeprazole for at least two weeks. We may then retest you for H. Pylori. If needed, we will refer you to a stomach specialist Psychologist, sport and exercise) for further evaluation.  -VAGINAL DISCHARGE: The vaginal discharge you've been experiencing is likely due to the antibiotics you're taking for H. Pylori. We will prescribe a medication called Fluconazole to treat this. If your symptoms continue, we may need to do a vaginal swab for further evaluation.  -LEFT SHOULDER PAIN: Your shoulder pain is likely due to muscle strain, possibly from lifting heavy objects. It seems to be improving. We will prescribe Ibuprofen for pain relief as needed. Please continue to monitor your symptoms.  -CHRONIC BREATHING DIFFICULTIES: Your breathing difficulties are currently being evaluated by a lung specialist (Pulmonologist). You can continue to use Albuterol as needed, but not more than every 4-6 hours. We will also consider the impact of Albuterol on your stomach reflux symptoms.  INSTRUCTIONS:  Please continue to take your medications as prescribed. Take the Fluconazole on 04/01/2023, after you finish your antibiotics. Use Ibuprofen as needed for shoulder pain. Continue to follow up with the lung specialist and use Albuterol as needed, but not more than every 4-6 hours. If you have any questions or concerns, or if your symptoms worsen,  please contact our office.

## 2023-03-28 NOTE — Progress Notes (Signed)
Assessment/Plan:   Problem List Items Addressed This Visit       Digestive   Gastroesophageal reflux disease   Relevant Orders   Ambulatory referral to Gastroenterology     Other   Dyspnea    Chronic breathing difficulties: Currently under evaluation by Pulmonology. Albuterol use has provided some relief but may be exacerbating gastric reflux. -Continue follow-up with Pulmonology. -Use Albuterol as needed, not more than every 4-6 hours. -Consider impact of Albuterol on gastric reflux symptoms.            H. pylori infection    H. Pylori infection: Currently on triple therapy (Omeprazole 20mg  BID, Metronidazole 500mg  BID, Clarithromycin 500mg  BID) with expected completion on 03/31/2023. Noted improvement in chest discomfort and breathing since starting therapy. -Complete current course of triple therapy. -Off Omeprazole for at least 2 weeks after completion of therapy. -Consider retesting for H. Pylori via stool or breath test after 2 weeks off Omeprazole. -Refer to Gastroenterology for further evaluation and possible endoscopy.       Relevant Medications   fluconazole (DIFLUCAN) 150 MG tablet   Other Relevant Orders   Ambulatory referral to Gastroenterology   Acute pain of left shoulder    Likely musculoskeletal in nature, possibly related to heavy lifting. Improvement noted over the past 3 weeks. -Prescribe Ibuprofen 800mg  as needed for pain. -Continue to monitor symptoms.       Relevant Medications   ibuprofen (ADVIL) 800 MG tablet   Vaginal discharge - Primary    Vaginal discharge: Likely secondary to antibiotic use for H. Pylori treatment. -Prescribe Fluconazole to be taken on 04/01/2023, after completion of antibiotics. -If symptoms persist, consider vaginal swab for further evaluation.      Relevant Medications   fluconazole (DIFLUCAN) 150 MG tablet    There are no discontinued medications.  Return in about 2 weeks (around 04/11/2023), or if symptoms  worsen or fail to improve, for vaginal discharge.    Subjective:   Encounter date: 03/28/2023  ALEYSHA LAKIN is a 37 y.o. female who has COVID-19 long hauler manifesting chronic dyspnea; IIH (idiopathic intracranial hypertension); Vitamin D deficiency; Hyperlipidemia; Birth control counseling; Fibroid; Gastroesophageal reflux disease; Dyspnea; B12 deficiency; H. pylori infection; Acute pain of left shoulder; and Vaginal discharge on their problem list..   She  has a past medical history of IIH (idiopathic intracranial hypertension) and Papilledema..   She presents with chief complaint of Vaginal Itching (Vaginal itching after antibiotic rx's from 6/29 visit. Left shoulder blade pain. ) and PHQ/GAD follow up .  Discussed the use of AI scribe software for clinical note transcription with the patient, who gave verbal consent to proceed.  History of Present Illness   The patient presents with multiple ongoing health concerns, including shoulder pain, anxiety, and vaginal discharge following treatment for H. Pylori. She has also been experiencing persistent breathing difficulties and has been referred to pulmonology, but testing has been delayed.  The patient began triple therapy for H. Pylori on June 25th, including omeprazole, metronidazole, and clarithromycin. She reports that the course of antibiotics should be completed by the end of the week. Since starting this treatment, the patient has noticed an improvement in her chest discomfort and breathing difficulties, suggesting that these symptoms may have been related to heartburn caused by the H. Pylori infection.  However, the patient has also developed vaginal discharge, which she believes may be a yeast infection caused by the antibiotics. She has previously experienced similar symptoms while on antibiotics  and was treated with Diflucan (fluconazole).  The patient also reports persistent left shoulder pain, which she believes may have been  caused by lifting heavy objects. The pain has been present for about three weeks and is gradually improving. The patient describes the pain as sharp and tender, located on the inside of the shoulder blade. The pain has been causing weakness and numbness in the arm and fingers. The patient has been using a topical gel for pain relief.  The patient has been using albuterol for her breathing difficulties, which she believes may have caused an episode of tachycardia. She reports that the albuterol helps initially, but then seems to exacerbate her gastric reflux symptoms.         03/06/2023    4:11 PM 10/31/2022   10:13 AM  Depression screen PHQ 2/9  Decreased Interest 0 0  Down, Depressed, Hopeless 0 0  PHQ - 2 Score 0 0  Altered sleeping  0  Tired, decreased energy  0  Change in appetite  0  Feeling bad or failure about yourself   0  Trouble concentrating  0  Moving slowly or fidgety/restless  0  Suicidal thoughts  0  PHQ-9 Score  0  Difficult doing work/chores  Not difficult at all      10/31/2022   10:14 AM  GAD 7 : Generalized Anxiety Score  Nervous, Anxious, on Edge 1  Control/stop worrying 0  Worry too much - different things 1  Trouble relaxing 0  Restless 0  Easily annoyed or irritable 0  Afraid - awful might happen 0  Total GAD 7 Score 2  Anxiety Difficulty Not difficult at all      Review of Systems  Constitutional:  Negative for chills and fever.  Respiratory:  Positive for shortness of breath.   Cardiovascular:  Positive for chest pain.  Gastrointestinal:  Positive for heartburn. Negative for abdominal pain.  Genitourinary:  Negative for dysuria, flank pain, frequency, hematuria and urgency.       Vaginal discharge, vaginal bleeding, but started menses today  Musculoskeletal:  Positive for joint pain (shoulder).    No past surgical history on file.  Outpatient Medications Prior to Visit  Medication Sig Dispense Refill   acetaZOLAMIDE (DIAMOX) 250 MG tablet  TAKE 1 TABLET BY MOUTH TWICE A DAY 60 tablet 2   clarithromycin (BIAXIN) 500 MG tablet Take 1 tablet (500 mg total) by mouth 2 (two) times daily. 28 tablet 0   metroNIDAZOLE (FLAGYL) 500 MG tablet Take 1 tablet (500 mg total) by mouth 2 (two) times daily. 28 tablet 0   omeprazole (PRILOSEC) 20 MG capsule Take 1 capsule (20 mg total) by mouth 2 (two) times daily before a meal. 28 capsule 0   rosuvastatin (CRESTOR) 10 MG tablet Take 1 tablet (10 mg total) by mouth daily. 90 tablet 3   albuterol (VENTOLIN HFA) 108 (90 Base) MCG/ACT inhaler TAKE 2 PUFFS BY MOUTH EVERY 6 HOURS AS NEEDED FOR WHEEZE OR SHORTNESS OF BREATH (Patient not taking: Reported on 03/28/2023) 18 each 0   Multiple Vitamins-Minerals (MULTIVITAMIN WITH MINERALS) tablet Take 1 tablet by mouth daily. (Patient not taking: Reported on 03/28/2023)     No facility-administered medications prior to visit.    Family History  Problem Relation Age of Onset   Hypertension Mother    Diabetes Mother    Hyperlipidemia Mother    Ovarian cysts Mother     Social History   Socioeconomic History   Marital status: Single  Spouse name: Not on file   Number of children: Not on file   Years of education: Not on file   Highest education level: Not on file  Occupational History   Not on file  Tobacco Use   Smoking status: Never    Passive exposure: Never   Smokeless tobacco: Not on file  Vaping Use   Vaping Use: Never used  Substance and Sexual Activity   Alcohol use: Not on file   Drug use: Not on file   Sexual activity: Not on file  Other Topics Concern   Not on file  Social History Narrative   Not on file   Social Determinants of Health   Financial Resource Strain: Not on file  Food Insecurity: Not on file  Transportation Needs: Not on file  Physical Activity: Not on file  Stress: Not on file  Social Connections: Not on file  Intimate Partner Violence: Not on file                                                                                                   Objective:  Physical Exam: BP 122/74 (BP Location: Left Arm, Patient Position: Sitting, Cuff Size: Large)   Pulse 84   Temp 97.9 F (36.6 C) (Temporal)   Wt 164 lb (74.4 kg)   LMP 03/28/2023   SpO2 99%   BMI 27.29 kg/m     Physical Exam Constitutional:      Appearance: Normal appearance.  HENT:     Head: Normocephalic and atraumatic.     Right Ear: Hearing normal.     Left Ear: Hearing normal.     Nose: Nose normal.  Eyes:     General: No scleral icterus.       Right eye: No discharge.        Left eye: No discharge.     Extraocular Movements: Extraocular movements intact.  Cardiovascular:     Comments: No cyanosis, no JVD Pulmonary:     Effort: Pulmonary effort is normal.     Comments: No auditory wheezing Musculoskeletal:     Right shoulder: Tenderness (Left lower shoulder blade and between that and thoracic spine) present.       Arms:  Skin:    General: Skin is warm.     Findings: No rash.  Neurological:     General: No focal deficit present.     Mental Status: She is alert.     Cranial Nerves: No cranial nerve deficit.  Psychiatric:        Mood and Affect: Mood normal.        Behavior: Behavior normal.        Thought Content: Thought content normal.        Judgment: Judgment normal.       DG Ribs Unilateral Left  Result Date: 03/11/2023 CLINICAL DATA:  Left chest pain following heavy lifting, initial encounter EXAM: LEFT RIBS - 2 VIEW COMPARISON:  11/16/2022 FINDINGS: No fracture or other bone lesions are seen involving the ribs. IMPRESSION: No acute abnormality noted. Electronically Signed   By: Loraine Leriche  Lukens M.D.   On: 03/11/2023 11:28    Recent Results (from the past 2160 hour(s))  H. pylori breath test     Status: Abnormal   Collection Time: 03/13/23 12:28 PM  Result Value Ref Range   H. pylori Breath Test DETECTED (A) NOT DETECTED    Comment: . Antimicrobials, proton pump inhibitors, and  bismuth preparations are known to suppress H. pylori, and  ingestion of these prior to H. pylori diagnostic testing may lead to false negative results. If clinically  indicated, the test may be repeated on a new specimen obtained two weeks after discontinuing treatment. However, a positive result is still clinically valid.         Garner Nash, MD, MS

## 2023-03-28 NOTE — Assessment & Plan Note (Signed)
Vaginal discharge: Likely secondary to antibiotic use for H. Pylori treatment. -Prescribe Fluconazole to be taken on 04/01/2023, after completion of antibiotics. -If symptoms persist, consider vaginal swab for further evaluation.

## 2023-04-02 DIAGNOSIS — M5412 Radiculopathy, cervical region: Secondary | ICD-10-CM | POA: Diagnosis not present

## 2023-04-03 ENCOUNTER — Encounter: Payer: Self-pay | Admitting: Gastroenterology

## 2023-04-04 DIAGNOSIS — Z79899 Other long term (current) drug therapy: Secondary | ICD-10-CM | POA: Diagnosis not present

## 2023-04-04 DIAGNOSIS — H52223 Regular astigmatism, bilateral: Secondary | ICD-10-CM | POA: Diagnosis not present

## 2023-04-04 DIAGNOSIS — H04123 Dry eye syndrome of bilateral lacrimal glands: Secondary | ICD-10-CM | POA: Diagnosis not present

## 2023-04-06 NOTE — Progress Notes (Unsigned)
NEUROLOGY FOLLOW UP OFFICE NOTE  Caroline Charles [id number]  Assessment/Plan:   Idiopathic intracranial hypertension   Decrease acetazolamide to 250mg  once daily.  Check BMP. Follow up with Dr. Lita Mains in October and have notes sent to me Follow up 6 months     Subjective:  Caroline Charles is a 37 year old female right who follows up for idiopathic intracranial hypertension.  UPDATE: Current medication:  acetazolamide 250mg  twice daily  Decreased acetazolamide to 250mg  twice daily.  In April, she had repeat eye exam without evidence of papilledema.  Acetazolamide was discontinued at that time but restarted a few days later as she endorsed increased headaches. Headaches overall improved, once in awhile.  Notes dry eye and sometimes a little lightheaded.  Has repeat eye exam in October.   HISTORY: On routine eye exam, she was evaluated by ophthalmology who noted bilateral papilledema on exam.  Visual field testing was normal.  MRI of brain, orbits and MRV of head with and without contrast performed on 2/2 and 10/23/2021 revealed partially empty sella, mild CSF prominence within the optic nerve sheaths, subtle intra-ocular protrusion of the optic nerve head and possible narrowing of the bilateral distal transverse sinuses but no mass lesion or dural sinus thrombosis noted.  She does have a Mirena IUD.  Reports some weight gain over the past year.  She reports history of migraines which became almost daily due to presumed stress as they resolved after receiving MRI results stating there was no tumor.  Denies visual obscurations or blurred vision.  Hears what sounds like "crickets" in her head but no tinnitus or pulsatile tinnitus.  Underwent lumbar puncture on 01/19/2022 which revealed opening pressure of 31 cm H2O.  Started on acetazolamide 500mg  twice daily.   Migraine are typically 8/10 pressure in temples and behind both eyes associated with photophobia and sometimes nausea, usually lasts 2-3  days.   PAST MEDICAL HISTORY: Past Medical History:  Diagnosis Date   IIH (idiopathic intracranial hypertension)    Papilledema     MEDICATIONS: Current Outpatient Medications on File Prior to Visit  Medication Sig Dispense Refill   acetaZOLAMIDE (DIAMOX) 250 MG tablet TAKE 1 TABLET BY MOUTH TWICE A DAY 60 tablet 2   albuterol (VENTOLIN HFA) 108 (90 Base) MCG/ACT inhaler TAKE 2 PUFFS BY MOUTH EVERY 6 HOURS AS NEEDED FOR WHEEZE OR SHORTNESS OF BREATH (Patient not taking: Reported on 03/28/2023) 18 each 0   clarithromycin (BIAXIN) 500 MG tablet Take 1 tablet (500 mg total) by mouth 2 (two) times daily. 28 tablet 0   ibuprofen (ADVIL) 800 MG tablet Take 1 tablet (800 mg total) by mouth every 8 (eight) hours as needed. 30 tablet 0   metroNIDAZOLE (FLAGYL) 500 MG tablet Take 1 tablet (500 mg total) by mouth 2 (two) times daily. 28 tablet 0   Multiple Vitamins-Minerals (MULTIVITAMIN WITH MINERALS) tablet Take 1 tablet by mouth daily. (Patient not taking: Reported on 03/28/2023)     omeprazole (PRILOSEC) 20 MG capsule Take 1 capsule (20 mg total) by mouth 2 (two) times daily before a meal. 28 capsule 0   rosuvastatin (CRESTOR) 10 MG tablet Take 1 tablet (10 mg total) by mouth daily. 90 tablet 3   No current facility-administered medications on file prior to visit.    ALLERGIES: No Known Allergies  FAMILY HISTORY: Family History  Problem Relation Age of Onset   Hypertension Mother    Diabetes Mother    Hyperlipidemia Mother    Ovarian cysts  Mother       Objective:  Blood pressure 110/80, pulse 78, resp. rate 20, height 5\' 5"  (1.651 m), weight 164 lb (74.4 kg), last menstrual period 03/28/2023, SpO2 98%, unknown if currently breastfeeding. General: No acute distress.  Patient appears well-groomed.   Head:  Normocephalic/atraumatic Eyes:  Fundi examined but not visualized Neck: supple, no paraspinal tenderness, full range of motion Heart:  Regular rate and rhythm Neurological Exam:  alert and oriented.  Speech fluent and not dysarthric, language intact.  CN II-XII intact. Bulk and tone normal, muscle strength 5/5 throughout.  Sensation to light touch intact.  Deep tendon reflexes 2+ throughout.  Finger to nose testing intact.  Gait normal, Romberg negative.   Shon Millet, DO  CC:  Fanny Bien, MD

## 2023-04-07 DIAGNOSIS — M542 Cervicalgia: Secondary | ICD-10-CM | POA: Diagnosis not present

## 2023-04-07 DIAGNOSIS — M5013 Cervical disc disorder with radiculopathy, cervicothoracic region: Secondary | ICD-10-CM | POA: Diagnosis not present

## 2023-04-10 ENCOUNTER — Encounter: Payer: Self-pay | Admitting: Neurology

## 2023-04-10 ENCOUNTER — Ambulatory Visit: Payer: 59 | Admitting: Neurology

## 2023-04-10 ENCOUNTER — Other Ambulatory Visit (INDEPENDENT_AMBULATORY_CARE_PROVIDER_SITE_OTHER): Payer: 59

## 2023-04-10 VITALS — BP 110/80 | HR 78 | Resp 20 | Ht 65.0 in | Wt 164.0 lb

## 2023-04-10 DIAGNOSIS — Z79899 Other long term (current) drug therapy: Secondary | ICD-10-CM | POA: Diagnosis not present

## 2023-04-10 DIAGNOSIS — G932 Benign intracranial hypertension: Secondary | ICD-10-CM | POA: Diagnosis not present

## 2023-04-10 MED ORDER — ACETAZOLAMIDE 250 MG PO TABS: 250.0000 mg | ORAL_TABLET | Freq: Every day | ORAL | 1 refills | Status: DC

## 2023-04-10 NOTE — Patient Instructions (Addendum)
Decrease acetazolamide 250mg  to ONCE A DAY.  If headaches return, contact me and increase back to twice a day. Have eye exam results sent to me Check BMP Follow up 6 months.

## 2023-04-11 LAB — BASIC METABOLIC PANEL
BUN: 13 mg/dL (ref 6–23)
CO2: 23 mEq/L (ref 19–32)
Calcium: 9.3 mg/dL (ref 8.4–10.5)
Chloride: 106 mEq/L (ref 96–112)
Creatinine, Ser: 0.85 mg/dL (ref 0.40–1.20)
GFR: 87.84 mL/min (ref >60.00)
Glucose, Bld: 80 mg/dL (ref 70–99)
Potassium: 3.9 mEq/L (ref 3.5–5.1)
Sodium: 136 mEq/L (ref 135–145)

## 2023-04-18 ENCOUNTER — Other Ambulatory Visit: Payer: Self-pay | Admitting: Neurology

## 2023-04-19 DIAGNOSIS — M542 Cervicalgia: Secondary | ICD-10-CM | POA: Diagnosis not present

## 2023-04-19 DIAGNOSIS — M5013 Cervical disc disorder with radiculopathy, cervicothoracic region: Secondary | ICD-10-CM | POA: Diagnosis not present

## 2023-04-24 DIAGNOSIS — M5412 Radiculopathy, cervical region: Secondary | ICD-10-CM | POA: Diagnosis not present

## 2023-04-24 DIAGNOSIS — M542 Cervicalgia: Secondary | ICD-10-CM | POA: Diagnosis not present

## 2023-04-24 DIAGNOSIS — M5013 Cervical disc disorder with radiculopathy, cervicothoracic region: Secondary | ICD-10-CM | POA: Diagnosis not present

## 2023-05-04 ENCOUNTER — Encounter (INDEPENDENT_AMBULATORY_CARE_PROVIDER_SITE_OTHER): Payer: Self-pay

## 2023-05-09 DIAGNOSIS — M5013 Cervical disc disorder with radiculopathy, cervicothoracic region: Secondary | ICD-10-CM | POA: Diagnosis not present

## 2023-05-09 DIAGNOSIS — M542 Cervicalgia: Secondary | ICD-10-CM | POA: Diagnosis not present

## 2023-05-23 DIAGNOSIS — M5013 Cervical disc disorder with radiculopathy, cervicothoracic region: Secondary | ICD-10-CM | POA: Diagnosis not present

## 2023-05-24 NOTE — Progress Notes (Unsigned)
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 No complaint of cough, chest pain, or SOB.  Dyspnea:  MMRC1 = can walk nl pace, flat grade, can't hurry or go uphills or steps s sob   Cough: minimal light white mucus not present on vacations/ weekends and worse at work in nail salon Sleep: bed is flat/ one pillow  SABA use: none / do not help  Overt HB /dyspepsa > seeing GI also for h pylori  No obvious day to day or daytime pattern/variability or assoc excess/ purulent sputum or mucus plugs or hemoptysis or cp or chest tightness, subjective wheeze or overt sinus   symptoms.   Also denies any obvious fluctuation of symptoms with weather or environmental changes or other aggravating or alleviating factors except as outlined above   No unusual exposure hx or h/o childhood pna/ asthma or knowledge of premature birth.  Current Allergies, Complete Past Medical History, Past Surgical History, Family History, and Social History were reviewed in [employer] record.  ROS  The following are not active complaints unless bolded Hoarseness, sore throat, dysphagia, dental problems, itching, sneezing,  nasal congestion or discharge of excess mucus or purulent secretions, ear ache,   fever, chills, sweats, unintended wt loss or wt gain, classically pleuritic or exertional cp,  orthopnea pnd or arm/hand swelling  or leg swelling, presyncope, palpitations, abdominal pain, anorexia, nausea, vomiting,  diarrhea  or change in bowel habits or change in bladder habits, change in stools or change in urine, dysuria, hematuria,  rash, arthralgias, visual complaints, headache, numbness, weakness or ataxia or problems with walking or coordination,  change in mood or  memory.             Outpatient Medications Prior to Visit  Medication Sig Dispense Refill   acetaZOLAMIDE (DIAMOX) 250 MG tablet Take 1 tablet (250 mg total) by mouth daily. 90 tablet 1   Multiple Vitamins-Minerals (MULTIVITAMIN WITH MINERALS) tablet Take 1 tablet by mouth daily.     rosuvastatin (CRESTOR) 10 MG tablet Take 1 tablet (10 mg total) by mouth daily. 90 tablet 3   albuterol (VENTOLIN HFA) 108 (90 Base) MCG/ACT inhaler  Not using   0   ibuprofen (ADVIL) 800 MG tablet Take 1 tablet (800 mg total) by mouth every 8 (eight) hours as needed. (Patient not taking: Reported on 05/26/2023) 30 tablet 0   omeprazole (PRILOSEC) 20 MG capsule  Not using   0      Past Medical History:  Diagnosis Date   IIH (idiopathic intracranial hypertension)    Papilledema       Objective:     BP (!) 140/90   Pulse 72   Temp 98.7 F (37.1 C)   Ht 5\' 5"  (1.651 m)   Wt 157 lb (71.2 kg)   SpO2 100%   BMI 26.13 kg/m   SpO2: 100 %  RA  Amb pleasant  asian female nad   HEENT : Oropharynx  clear      Nasal turbinates nl    NECK :  without  apparent JVD/ palpable Nodes/TM    LUNGS: no acc muscle use,  Nl contour chest which is clear to A and P bilaterally without cough on insp or exp maneuvers   CV:  RRR  no s3 or murmur or increase in P2, and no edema   ABD:  soft and nontender with nl inspiratory excursion in the supine position. No bruits or organomegaly appreciated   MS:  Nl gait/ ext warm without deformities Or obvious joint restrictions  calf tenderness, cyanosis or clubbing    SKIN: warm and dry without lesions    NEURO:  alert, approp, nl sensorium with  no motor or cerebellar deficits apparent.       Assessment    Upper airway cough syndrome Onset 2020 worse p covid summer 2022 assoc with overt HB/dyspepsia with suspected H Pylori -  PFTs 05/25/23 wnl baseline, marked deteriration p saba with atypical obst pattern ? VCD?  Upper airway cough syndrome (previously labeled PNDS),  is so named because it's frequently impossible to sort out how much is  CR/sinusitis with freq throat clearing (which can be related to primary GERD)   vs  causing  secondary (" extra esophageal")  GERD from wide swings in gastric pressure that occur with throat clearing, often  promoting self use of mint and menthol lozenges that reduce the lower esophageal sphincter tone and exacerbate the problem further in a cyclical fashion.   These are the same pts (now being labeled as having "irritable larynx syndrome" by some cough centers) who not infrequently have a history of having failed to tolerate ace inhibitors,  dry powder inhalers or biphosphonates or report having atypical/extraesophageal reflux symptoms that don't respond to standard doses of PPI  and are easily confused as having aecopd or asthma flares by even experienced allergists/ pulmonologists (myself included).   Clearly worse p saba typical of UACS likely related to occupation exp to irritating fumes in setting of what sound like chronic poorly controlled gerd so rec   Max rx for gerd, both acid suppression and  GERD diet reviewed, bed blocks rec   F/u with GI as planned and if not improved then return here or refer to ENT next step.   Discussed in detail all the  indications, usual  risks and alternatives  relative to the benefits with patient who agrees to proceed with Rx as outlined.             Each maintenance medication was reviewed in detail including emphasizing most importantly the difference between maintenance and prns and under what circumstances the prns are to be triggered using an action plan format where appropriate.  Total time for H and P, chart  review, counseling,  and generating customized AVS unique to this office visit / same day charting  > 40 min with pt new to me           Sandrea Hughs, MD 05/26/2023

## 2023-05-25 ENCOUNTER — Ambulatory Visit (INDEPENDENT_AMBULATORY_CARE_PROVIDER_SITE_OTHER): Payer: 59 | Admitting: Internal Medicine

## 2023-05-25 DIAGNOSIS — M5013 Cervical disc disorder with radiculopathy, cervicothoracic region: Secondary | ICD-10-CM | POA: Diagnosis not present

## 2023-05-25 DIAGNOSIS — R053 Chronic cough: Secondary | ICD-10-CM

## 2023-05-25 DIAGNOSIS — R0609 Other forms of dyspnea: Secondary | ICD-10-CM

## 2023-05-25 DIAGNOSIS — M542 Cervicalgia: Secondary | ICD-10-CM | POA: Diagnosis not present

## 2023-05-25 LAB — PULMONARY FUNCTION TEST
DL/VA % pred: 147 %
DL/VA: 6.54 ml/min/mmHg/L
DLCO cor % pred: 122 %
DLCO cor: 27.87 ml/min/mmHg
DLCO unc % pred: 122 %
DLCO unc: 27.87 ml/min/mmHg
FEF 25-75 Post: 1.09 L/s
FEF 25-75 Pre: 3.03 L/s
FEF2575-%Change-Post: -63 %
FEF2575-%Pred-Post: 33 %
FEF2575-%Pred-Pre: 92 %
FEV1-%Change-Post: -24 %
FEV1-%Pred-Post: 62 %
FEV1-%Pred-Pre: 82 %
FEV1-Post: 1.98 L
FEV1-Pre: 2.62 L
FEV1FVC-%Change-Post: -20 %
FEV1FVC-%Pred-Pre: 100 %
FEV6-%Change-Post: -5 %
FEV6-%Pred-Post: 78 %
FEV6-%Pred-Pre: 83 %
FEV6-Post: 2.96 L
FEV6-Pre: 3.15 L
FEV6FVC-%Change-Post: -1 %
FEV6FVC-%Pred-Post: 100 %
FEV6FVC-%Pred-Pre: 101 %
FVC-%Change-Post: -4 %
FVC-%Pred-Post: 77 %
FVC-%Pred-Pre: 81 %
FVC-Post: 3 L
FVC-Pre: 3.15 L
Post FEV1/FVC ratio: 66 %
Post FEV6/FVC ratio: 99 %
Pre FEV1/FVC ratio: 83 %
Pre FEV6/FVC Ratio: 100 %
RV % pred: 86 %
RV: 1.36 L
TLC % pred: 86 %
TLC: 4.52 L

## 2023-05-25 NOTE — Patient Instructions (Signed)
Full PFT performed today. °

## 2023-05-25 NOTE — Progress Notes (Signed)
Full PFT performed today. °

## 2023-05-26 ENCOUNTER — Encounter: Payer: Self-pay | Admitting: Internal Medicine

## 2023-05-26 ENCOUNTER — Ambulatory Visit: Payer: 59 | Admitting: Internal Medicine

## 2023-05-26 VITALS — BP 140/90 | HR 72 | Temp 98.7°F | Ht 65.0 in | Wt 157.0 lb

## 2023-05-26 DIAGNOSIS — R058 Other specified cough: Secondary | ICD-10-CM | POA: Diagnosis not present

## 2023-05-26 MED ORDER — FAMOTIDINE 20 MG PO TABS: ORAL_TABLET | ORAL | 11 refills | Status: DC

## 2023-05-26 MED ORDER — PANTOPRAZOLE SODIUM 40 MG PO TBEC
40.0000 mg | DELAYED_RELEASE_TABLET | Freq: Every day | ORAL | 2 refills | Status: DC
Start: 2023-05-26 — End: 2023-06-26

## 2023-05-26 NOTE — Patient Instructions (Signed)
Pantoprazole (protonix) 40 mg   Take  30-60 min before first meal of the day and Pepcid (famotidine)  20 mg after supper until / unless GI doctors say otherwise   GERD (REFLUX)  is an extremely common cause of respiratory symptoms just like yours , many times with no obvious heartburn at all.    It can be treated with medication, but also with lifestyle changes including elevation of the head of your bed (ideally with 6 -8inch blocks under the headboard of your bed),  Smoking cessation, avoidance of late meals, excessive alcohol, and avoid fatty foods, chocolate, peppermint, colas, red wine, and acidic juices such as orange juice.  NO MINT OR MENTHOL PRODUCTS SO NO COUGH DROPS - LUDEN's  USE SUGARLESS CANDY INSTEAD (Jolley ranchers or Stover's or Life Savers) or even ice chips will also do - the key is to swallow to prevent all throat clearing. NO OIL BASED VITAMINS - use powdered substitutes.  Avoid fish oil when coughing.     When you are done with the GI doctors, if you are not happy return

## 2023-05-27 ENCOUNTER — Encounter: Payer: Self-pay | Admitting: Internal Medicine

## 2023-05-27 NOTE — Assessment & Plan Note (Addendum)
Onset 2020 worse p covid summer 2022 assoc with overt HB/dyspepsia with suspected H Pylori -  PFTs 05/25/23 wnl baseline, marked deteriration p saba with atypical obst pattern ? VCD?  Upper airway cough syndrome (previously labeled PNDS),  is so named because it's frequently impossible to sort out how much is  CR/sinusitis with freq throat clearing (which can be related to primary GERD)   vs  causing  secondary (" extra esophageal")  GERD from wide swings in gastric pressure that occur with throat clearing, often  promoting self use of mint and menthol lozenges that reduce the lower esophageal sphincter tone and exacerbate the problem further in a cyclical fashion.   These are the same pts (now being labeled as having "irritable larynx syndrome" by some cough centers) who not infrequently have a history of having failed to tolerate ace inhibitors,  dry powder inhalers or biphosphonates or report having atypical/extraesophageal reflux symptoms that don't respond to standard doses of PPI  and are easily confused as having aecopd or asthma flares by even experienced allergists/ pulmonologists (myself included).   Clearly worse p saba typical of UACS likely related to occupation exp to irritating fumes in setting of what sound like chronic poorly controlled gerd so rec   Max rx for gerd, both acid suppression and  GERD diet reviewed, bed blocks rec   F/u with GI as planned and if not improved then return here or refer to ENT next step.   Discussed in detail all the  indications, usual  risks and alternatives  relative to the benefits with patient who agrees to proceed with Rx as outlined.             Each maintenance medication was reviewed in detail including emphasizing most importantly the difference between maintenance and prns and under what circumstances the prns are to be triggered using an action plan format where appropriate.  Total time for H and P, chart review, counseling,  and generating  customized AVS unique to this office visit / same day charting  > 40 min with pt new to me

## 2023-06-01 ENCOUNTER — Other Ambulatory Visit: Payer: Self-pay | Admitting: Nurse Practitioner

## 2023-06-01 DIAGNOSIS — A048 Other specified bacterial intestinal infections: Secondary | ICD-10-CM

## 2023-06-05 ENCOUNTER — Ambulatory Visit (INDEPENDENT_AMBULATORY_CARE_PROVIDER_SITE_OTHER): Payer: 59 | Admitting: Family Medicine

## 2023-06-05 ENCOUNTER — Encounter: Payer: Self-pay | Admitting: Family Medicine

## 2023-06-05 VITALS — BP 118/82 | HR 72 | Temp 97.8°F | Wt 156.6 lb

## 2023-06-05 DIAGNOSIS — D751 Secondary polycythemia: Secondary | ICD-10-CM | POA: Insufficient documentation

## 2023-06-05 DIAGNOSIS — Z1159 Encounter for screening for other viral diseases: Secondary | ICD-10-CM | POA: Diagnosis not present

## 2023-06-05 DIAGNOSIS — N3001 Acute cystitis with hematuria: Secondary | ICD-10-CM | POA: Diagnosis not present

## 2023-06-05 DIAGNOSIS — E7800 Pure hypercholesterolemia, unspecified: Secondary | ICD-10-CM

## 2023-06-05 DIAGNOSIS — M25512 Pain in left shoulder: Secondary | ICD-10-CM | POA: Diagnosis not present

## 2023-06-05 DIAGNOSIS — G932 Benign intracranial hypertension: Secondary | ICD-10-CM | POA: Diagnosis not present

## 2023-06-05 DIAGNOSIS — R631 Polydipsia: Secondary | ICD-10-CM | POA: Insufficient documentation

## 2023-06-05 DIAGNOSIS — E559 Vitamin D deficiency, unspecified: Secondary | ICD-10-CM

## 2023-06-05 DIAGNOSIS — G8929 Other chronic pain: Secondary | ICD-10-CM | POA: Diagnosis not present

## 2023-06-05 DIAGNOSIS — K219 Gastro-esophageal reflux disease without esophagitis: Secondary | ICD-10-CM

## 2023-06-05 LAB — COMPREHENSIVE METABOLIC PANEL
ALT: 17 U/L (ref 0–35)
AST: 15 U/L (ref 0–37)
Albumin: 4.2 g/dL (ref 3.5–5.2)
Alkaline Phosphatase: 54 U/L (ref 39–117)
BUN: 13 mg/dL (ref 6–23)
CO2: 22 meq/L (ref 19–32)
Calcium: 8.9 mg/dL (ref 8.4–10.5)
Chloride: 106 meq/L (ref 96–112)
Creatinine, Ser: 0.83 mg/dL (ref 0.40–1.20)
GFR: 90.29 mL/min (ref >60.00)
Glucose, Bld: 78 mg/dL (ref 70–99)
Potassium: 3.9 mEq/L (ref 3.5–5.1)
Sodium: 138 meq/L (ref 135–145)
Total Bilirubin: 0.5 mg/dL (ref 0.2–1.2)
Total Protein: 7 g/dL (ref 6.0–8.3)

## 2023-06-05 LAB — HEMOGLOBIN A1C: Hgb A1c MFr Bld: 5.3 % (ref 4.6–6.5)

## 2023-06-05 LAB — CBC WITH DIFFERENTIAL/PLATELET
Basophils Absolute: 0 10*3/uL (ref 0.0–0.1)
Basophils Relative: 0.6 % (ref 0.0–3.0)
Eosinophils Absolute: 0 10*3/uL (ref 0.0–0.7)
Eosinophils Relative: 0.5 % (ref 0.0–5.0)
HCT: 48.2 % — ABNORMAL HIGH (ref 36.0–46.0)
Hemoglobin: 15.1 g/dL — ABNORMAL HIGH (ref 12.0–15.0)
Lymphocytes Relative: 26.1 % (ref 12.0–46.0)
Lymphs Abs: 1.7 10*3/uL (ref 0.7–4.0)
MCHC: 31.4 g/dL (ref 30.0–36.0)
MCV: 85.6 fl (ref 78.0–100.0)
Monocytes Absolute: 0.4 10*3/uL (ref 0.1–1.0)
Monocytes Relative: 6.2 % (ref 3.0–12.0)
Neutro Abs: 4.4 10*3/uL (ref 1.4–7.7)
Neutrophils Relative %: 66.6 % (ref 43.0–77.0)
Platelets: 294 10*3/uL (ref 150.0–400.0)
RBC: 5.63 Mil/uL — ABNORMAL HIGH (ref 3.87–5.11)
RDW: 13.9 % (ref 11.5–15.5)
WBC: 6.6 10*3/uL (ref 4.0–10.5)

## 2023-06-05 LAB — VITAMIN D 25 HYDROXY (VIT D DEFICIENCY, FRACTURES): VITD: 18.13 ng/mL — ABNORMAL LOW (ref 30.00–100.00)

## 2023-06-05 LAB — TSH: TSH: 2.64 u[IU]/mL (ref 0.35–5.50)

## 2023-06-05 MED ORDER — VITAMIN D (ERGOCALCIFEROL) 1.25 MG (50000 UNIT) PO CAPS
50000.0000 [IU] | ORAL_CAPSULE | ORAL | 0 refills | Status: DC
Start: 2023-06-05 — End: 2023-10-16

## 2023-06-05 MED ORDER — VITAMIN D3 50 MCG (2000 UT) PO CAPS
2000.0000 [IU] | ORAL_CAPSULE | Freq: Every day | ORAL | 3 refills | Status: DC
Start: 2023-06-05 — End: 2023-10-16

## 2023-06-05 NOTE — Assessment & Plan Note (Signed)
With polyuria.  Recent diagnosis of diabetes in father and family history in mother. Screen for diabetes

## 2023-06-05 NOTE — Progress Notes (Signed)
Assessment/Plan:   Problem List Items Addressed This Visit       Digestive   Gastroesophageal reflux disease    Associated with H. pylori.  Symptoms well-controlled on pantoprazole.  Improvement in dyspnea and cough, likely GERD related given improvement with pantoprazole.  Plan: Continue pantoprazole 40 mg daily. Monitor respiratory and GI symptoms Follow-up with GI specialists         Nervous and Auditory   IIH (idiopathic intracranial hypertension)    Controlled on acetazolamide.  Concern for secondary side effects of acetazolamide including possible medication-induced tachycardia, mild dehydration leading to erythrocytosis.  Plan: Continue acetazolamide. Recheck CBC, electrolytes, kidney function. Educate about adequate hydration.      Relevant Orders   Comprehensive metabolic panel     Other   Vitamin D deficiency   Relevant Orders   VITAMIN D 25 Hydroxy (Vit-D Deficiency, Fractures)   Hyperlipidemia - Primary    Currently well-managed on rosuvastatin with significant improvement in LDL levels. Differential diagnosis: familial hypercholesterolemia. Plan: Continue rosuvastatin 10 mg daily. Perform familial hypercholesterolemia screening. Check lipid panel, liver function tests, and thyroid function.      Relevant Orders   Familial Hypercholesterolemia   TSH   Hemoglobin A1c   Comprehensive metabolic panel   Lipid Cascade w/Rflx to ApoliB   Chronic left shoulder pain    Unchanged with ibuprofen and PT.  Plan: Follow-up with orthopedics       Erythrocytosis   Relevant Orders   CBC with Differential/Platelet   Comprehensive metabolic panel   Polydipsia    With polyuria.  Recent diagnosis of diabetes in father and family history in mother. Screen for diabetes      Relevant Orders   Hemoglobin A1c   Comprehensive metabolic panel   Urinalysis w microscopic + reflex cultur   Other Visit Diagnoses     Screening for viral disease       Relevant  Orders   HCV Ab w Reflex to Quant PCR   HIV Antibody (routine testing w rflx)       Medications Discontinued During This Encounter  Medication Reason   famotidine (PEPCID) 20 MG tablet    omeprazole (PRILOSEC) 20 MG capsule    ibuprofen (ADVIL) 800 MG tablet    albuterol (VENTOLIN HFA) 108 (90 Base) MCG/ACT inhaler     Return in about 6 months (around 12/03/2023) for physical (fasting labs).    Subjective:   Encounter date: 06/05/2023  Caroline Charles is a 37 y.o. female who has COVID-19 long hauler manifesting chronic dyspnea; IIH (idiopathic intracranial hypertension); Vitamin D deficiency; Hyperlipidemia; Birth control counseling; Fibroid; Gastroesophageal reflux disease; Dyspnea; B12 deficiency; H. pylori infection; Chronic left shoulder pain; Vaginal discharge; Upper airway cough syndrome; Erythrocytosis; and Polydipsia on their problem list..   She  has a past medical history of IIH (idiopathic intracranial hypertension) and Papilledema..   Chief Complaint: Follow-up visit for hyperlipidemia, vaginal discharge, GI upset, shortness of breath, and shoulder pain.  History of Present Illness:  Hyperlipidemia. The patient has a history of hyperlipidemia and has been treated with rosuvastatin. Previous LDL in February was 191, which reduced to 78 by March after treatment. The patient may have familial hypercholesterolemia, given the remarkable improvement with medication and a family history of high cholesterol mother.  Patient also reports a recent diagnosis of diabetes in father  Vaginal Discharge  The patient reported vaginal discharge, resolved status post treatment fluconazole.   GERD Patient with GERD and positive H. pylori screening recently  for which she has been referred to GI. Previously on Pepcid and omeprazole, she was switched to pantoprazole by pulmonology, which has alleviated her symptoms.  Shortness of Breath and cough. The patient experienced shortness of breath.   It did not improve while using albuterol. She was referred to pulmonology, where tests showed normal pulmonary function test, and albuterol was discontinued. The patient placed onto pantoprazole and no longer experiences coughing.  Idiopathic Intracranial Hypertension. The patient is on acetazolamide 250 mg once a day for idiopathic intracranial hypertension. There were concerns about tachycardia potentially related to medications, noting a prior instance of high heart rate attributed to albuterol.  Erythrocytosis and Possible Dehydration. The patient has presented with erythrocytosis, possibly due to mild dehydration, which may also be linked to her heart rate issues.  Shoulder Pain. The patient has ongoing left shoulder pain. Physical therapy and ibuprofen have not provided relief. An MRI is pending through orthopedics.  Review of Systems  Eyes:  Negative for blurred vision.  Respiratory:  Negative for cough, shortness of breath and wheezing.   Cardiovascular:  Negative for chest pain and palpitations.  Gastrointestinal:  Positive for heartburn.  Genitourinary:  Positive for frequency (Especially taking acetazolamide).  Musculoskeletal:  Positive for joint pain (Left shoulder).  Neurological:  Positive for headaches (Well-controlled on acetazolamide).  Endo/Heme/Allergies:  Positive for polydipsia (Especially when taking acetazolamide).  All other systems reviewed and are negative.   No past surgical history on file.  Outpatient Medications Prior to Visit  Medication Sig Dispense Refill   acetaZOLAMIDE (DIAMOX) 250 MG tablet Take 1 tablet (250 mg total) by mouth daily. 90 tablet 1   Multiple Vitamins-Minerals (MULTIVITAMIN WITH MINERALS) tablet Take 1 tablet by mouth daily.     pantoprazole (PROTONIX) 40 MG tablet Take 1 tablet (40 mg total) by mouth daily. Take 30-60 min before first meal of the day 30 tablet 2   rosuvastatin (CRESTOR) 10 MG tablet Take 1 tablet (10 mg total) by  mouth daily. 90 tablet 3   albuterol (VENTOLIN HFA) 108 (90 Base) MCG/ACT inhaler TAKE 2 PUFFS BY MOUTH EVERY 6 HOURS AS NEEDED FOR WHEEZE OR SHORTNESS OF BREATH (Patient not taking: Reported on 03/28/2023) 18 each 0   famotidine (PEPCID) 20 MG tablet One after supper (Patient not taking: Reported on 06/05/2023) 30 tablet 11   ibuprofen (ADVIL) 800 MG tablet Take 1 tablet (800 mg total) by mouth every 8 (eight) hours as needed. (Patient not taking: Reported on 05/26/2023) 30 tablet 0   omeprazole (PRILOSEC) 20 MG capsule Take 1 capsule (20 mg total) by mouth 2 (two) times daily before a meal. (Patient not taking: Reported on 05/26/2023) 28 capsule 0   No facility-administered medications prior to visit.    Family History  Problem Relation Age of Onset   Hypertension Mother    Diabetes Mother    Hyperlipidemia Mother    Ovarian cysts Mother    Diabetes Father     Social History   Socioeconomic History   Marital status: Single    Spouse name: Not on file   Number of children: Not on file   Years of education: Not on file   Highest education level: Not on file  Occupational History   Not on file  Tobacco Use   Smoking status: Never    Passive exposure: Never   Smokeless tobacco: Not on file  Vaping Use   Vaping status: Never Used  Substance and Sexual Activity   Alcohol use: Not on  file   Drug use: Not on file   Sexual activity: Not on file  Other Topics Concern   Not on file  Social History Narrative   Right handed   Drinks caffeine prn   Lives with parents and two children   Currently working   One floor home   Social Determinants of Health   Financial Resource Strain: Not on file  Food Insecurity: Not on file  Transportation Needs: Not on file  Physical Activity: Not on file  Stress: Not on file  Social Connections: Not on file  Intimate Partner Violence: Not on file                                                                                                   Objective:  Physical Exam: BP 118/82 (BP Location: Left Arm, Patient Position: Sitting, Cuff Size: Large)   Pulse 72   Temp 97.8 F (36.6 C) (Temporal)   Wt 156 lb 9.6 oz (71 kg)   LMP 06/05/2023   SpO2 100%   BMI 26.06 kg/m     Physical Exam Constitutional:      General: She is not in acute distress.    Appearance: Normal appearance. She is not ill-appearing or toxic-appearing.  HENT:     Head: Normocephalic and atraumatic.     Nose: Nose normal. No congestion.  Eyes:     General: No scleral icterus.    Extraocular Movements: Extraocular movements intact.  Cardiovascular:     Rate and Rhythm: Normal rate and regular rhythm.     Pulses: Normal pulses.     Heart sounds: Normal heart sounds.  Pulmonary:     Effort: Pulmonary effort is normal. No respiratory distress.     Breath sounds: Normal breath sounds.  Abdominal:     General: Abdomen is flat. Bowel sounds are normal.     Palpations: Abdomen is soft.  Musculoskeletal:        General: Normal range of motion.  Lymphadenopathy:     Cervical: No cervical adenopathy.  Skin:    General: Skin is warm and dry.     Findings: No rash.  Neurological:     General: No focal deficit present.     Mental Status: She is alert and oriented to person, place, and time. Mental status is at baseline.  Psychiatric:        Mood and Affect: Mood normal.        Behavior: Behavior normal.        Thought Content: Thought content normal.        Judgment: Judgment normal.     No results found.  Recent Results (from the past 2160 hour(s))  H. pylori breath test     Status: Abnormal   Collection Time: 03/13/23 12:28 PM  Result Value Ref Range   H. pylori Breath Test DETECTED (A) NOT DETECTED    Comment: . Antimicrobials, proton pump inhibitors, and bismuth preparations are known to suppress H. pylori, and  ingestion of these prior to H. pylori diagnostic testing may lead to false negative results. If clinically  indicated, the  test may be repeated on a new specimen obtained two weeks after discontinuing treatment. However, a positive result is still clinically valid.   Basic metabolic panel     Status: None   Collection Time: 04/10/23  2:17 PM  Result Value Ref Range   Sodium 136 135 - 145 mEq/L   Potassium 3.9 3.5 - 5.1 mEq/L   Chloride 106 96 - 112 mEq/L   CO2 23 19 - 32 mEq/L   Glucose, Bld 80 70 - 99 mg/dL   BUN 13 6 - 23 mg/dL   Creatinine, Ser 2.44 0.40 - 1.20 mg/dL   GFR 01.02 >72.53 mL/min    Comment: Calculated using the CKD-EPI Creatinine Equation (2021)   Calcium 9.3 8.4 - 10.5 mg/dL  Pulmonary function test     Status: None   Collection Time: 05/25/23  3:37 PM  Result Value Ref Range   FVC-Pre 3.15 L   FVC-%Pred-Pre 81 %   FVC-Post 3.00 L   FVC-%Pred-Post 77 %   FVC-%Change-Post -4 %   FEV1-Pre 2.62 L   FEV1-%Pred-Pre 82 %   FEV1-Post 1.98 L   FEV1-%Pred-Post 62 %   FEV1-%Change-Post -24 %   FEV6-Pre 3.15 L   FEV6-%Pred-Pre 83 %   FEV6-Post 2.96 L   FEV6-%Pred-Post 78 %   FEV6-%Change-Post -5 %   Pre FEV1/FVC ratio 83 %   FEV1FVC-%Pred-Pre 100 %   Post FEV1/FVC ratio 66 %   FEV1FVC-%Change-Post -20 %   Pre FEV6/FVC Ratio 100 %   FEV6FVC-%Pred-Pre 101 %   Post FEV6/FVC ratio 99 %   FEV6FVC-%Pred-Post 100 %   FEV6FVC-%Change-Post -1 %   FEF 25-75 Pre 3.03 L/sec   FEF2575-%Pred-Pre 92 %   FEF 25-75 Post 1.09 L/sec   FEF2575-%Pred-Post 33 %   FEF2575-%Change-Post -63 %   RV 1.36 L   RV % pred 86 %   TLC 4.52 L   TLC % pred 86 %   DLCO unc 27.87 ml/min/mmHg   DLCO unc % pred 122 %   DLCO cor 27.87 ml/min/mmHg   DLCO cor % pred 122 %   DL/VA 6.64 ml/min/mmHg/L   DL/VA % pred 403 %   This SmartLink has not been configured with any valid records.   Lab Results  Component Value Date   WBC 7.1 11/28/2022   HGB 15.2 (H) 11/28/2022   HCT 47.1 (H) 11/28/2022   MCV 84.8 11/28/2022   PLT 344.0 11/28/2022        Garner Nash, MD, MS

## 2023-06-05 NOTE — Assessment & Plan Note (Signed)
Unchanged with ibuprofen and PT.  Plan: Follow-up with orthopedics

## 2023-06-05 NOTE — Assessment & Plan Note (Signed)
Currently well-managed on rosuvastatin with significant improvement in LDL levels. Differential diagnosis: familial hypercholesterolemia. Plan: Continue rosuvastatin 10 mg daily. Perform familial hypercholesterolemia screening. Check lipid panel, liver function tests, and thyroid function.

## 2023-06-05 NOTE — Addendum Note (Signed)
Addended by: Fanny Bien B on: 06/05/2023 03:14 PM   Modules accepted: Orders

## 2023-06-05 NOTE — Patient Instructions (Signed)
Continue taking rosuvastatin as prescribed for hyperlipidemia. Attend your appointment with the gastroenterologist for follow-up on H. Pylori. Return in 6 months for your annual physical and follow-up fasting bloodwork. Get the blood tests today for lipid panel, vitamin D, TSH, liver function, CBC, and screenings for diabetes and familial hypercholesterolemia to monitor your health. Stay hydrated and watch for any signs of dehydration or excessive thirst.

## 2023-06-05 NOTE — Assessment & Plan Note (Addendum)
Associated with H. pylori.  Symptoms well-controlled on pantoprazole.  Improvement in dyspnea and cough, likely GERD related given improvement with pantoprazole.  Plan: Continue pantoprazole 40 mg daily. Monitor respiratory and GI symptoms Follow-up with GI specialists

## 2023-06-05 NOTE — Assessment & Plan Note (Signed)
Controlled on acetazolamide.  Concern for secondary side effects of acetazolamide including possible medication-induced tachycardia, mild dehydration leading to erythrocytosis.  Plan: Continue acetazolamide. Recheck CBC, electrolytes, kidney function. Educate about adequate hydration.

## 2023-06-07 DIAGNOSIS — M542 Cervicalgia: Secondary | ICD-10-CM | POA: Diagnosis not present

## 2023-06-07 MED ORDER — ROSUVASTATIN CALCIUM 20 MG PO TABS
20.0000 mg | ORAL_TABLET | Freq: Every day | ORAL | 3 refills | Status: DC
Start: 2023-06-07 — End: 2023-12-07

## 2023-06-07 NOTE — Addendum Note (Signed)
Addended by: Fanny Bien B on: 06/07/2023 09:07 AM   Modules accepted: Orders

## 2023-06-08 LAB — URINE CULTURE
MICRO NUMBER:: 15474740
SPECIMEN QUALITY:: ADEQUATE

## 2023-06-08 LAB — URINALYSIS W MICROSCOPIC + REFLEX CULTURE
Bilirubin Urine: NEGATIVE
Glucose, UA: NEGATIVE
Hyaline Cast: NONE SEEN /LPF
Ketones, ur: NEGATIVE
Nitrites, Initial: NEGATIVE
Specific Gravity, Urine: 1.016 (ref 1.001–1.035)
pH: 8.5 — ABNORMAL HIGH (ref 5.0–8.0)

## 2023-06-08 LAB — HIV ANTIBODY (ROUTINE TESTING W REFLEX): HIV 1&2 Ab, 4th Generation: NONREACTIVE

## 2023-06-08 LAB — CULTURE INDICATED

## 2023-06-08 MED ORDER — CEPHALEXIN 500 MG PO CAPS
500.0000 mg | ORAL_CAPSULE | Freq: Two times a day (BID) | ORAL | 0 refills | Status: AC
Start: 2023-06-08 — End: 2023-06-11

## 2023-06-08 NOTE — Addendum Note (Signed)
Addended by: Fanny Bien B on: 06/08/2023 12:32 PM   Modules accepted: Orders

## 2023-06-12 LAB — HCV INTERPRETATION

## 2023-06-12 LAB — LIPID CASCADE W/RFLX TO APOLIB
Cholesterol, Total: 221 mg/dL — ABNORMAL HIGH (ref 100–199)
HDL: 68 mg/dL (ref >39)
LDL Chol Calc (NIH): 135 mg/dL — ABNORMAL HIGH (ref 0–99)
LDL/HDL Ratio: 2 ratio (ref 0.0–3.2)
Total Non-HDL-Chol (LDL+VLDL): 153 mg/dL — ABNORMAL HIGH (ref 0–129)
Triglycerides: 101 mg/dL (ref 0–149)

## 2023-06-12 LAB — FAMILIAL HYPERCHOLESTEROLEMIA

## 2023-06-12 LAB — HCV AB W REFLEX TO QUANT PCR: HCV Ab: NONREACTIVE

## 2023-06-15 ENCOUNTER — Ambulatory Visit: Payer: 59 | Admitting: Physician Assistant

## 2023-06-15 ENCOUNTER — Encounter: Payer: Self-pay | Admitting: Physician Assistant

## 2023-06-15 VITALS — BP 120/80 | HR 84 | Wt 153.0 lb

## 2023-06-15 DIAGNOSIS — K219 Gastro-esophageal reflux disease without esophagitis: Secondary | ICD-10-CM

## 2023-06-15 DIAGNOSIS — K649 Unspecified hemorrhoids: Secondary | ICD-10-CM

## 2023-06-15 DIAGNOSIS — A048 Other specified bacterial intestinal infections: Secondary | ICD-10-CM

## 2023-06-15 DIAGNOSIS — K5904 Chronic idiopathic constipation: Secondary | ICD-10-CM

## 2023-06-15 DIAGNOSIS — R1319 Other dysphagia: Secondary | ICD-10-CM | POA: Diagnosis not present

## 2023-06-15 MED ORDER — HYDROCORTISONE (PERIANAL) 2.5 % EX CREA: 1.0000 | TOPICAL_CREAM | Freq: Two times a day (BID) | CUTANEOUS | 2 refills | Status: DC

## 2023-06-15 MED ORDER — HYDROCORTISONE ACETATE 25 MG RE SUPP: 25.0000 mg | Freq: Two times a day (BID) | RECTAL | 0 refills | Status: DC

## 2023-06-15 NOTE — Patient Instructions (Addendum)
You have been scheduled for an endoscopy. Please follow written instructions given to you at your visit today.  We have sent the following medications to your pharmacy for you to pick up at your convenience: Hydrocortisone (cream+ suppositories)  Miralax is an osmotic laxative.  It only brings more water into the stool.  This is safe to take daily.  Can take up to 17 gram of miralax twice a day.  Mix with juice or coffee.  Start 1 capful at night for 3-4 days and reassess your response in 3-4 days.  You can increase and decrease the dose based on your response.  Remember, it can take up to 3-4 days to take effect OR for the effects to wear off.   I often pair this with benefiber in the morning to help assure the stool is not too loose.   Please do sitz baths- these can be found at the pharmacy. It is a Chief Operating Officer that is put in your toliet.  Please increase fiber or add benefiber, increase water and increase acitivity.  Will send in hydrocoritsone suppository, cheapest with GOODRX from [facility], [facility], [employer] or [facility] if your insurance does not pay for it. If the hemorrhoid suppository sent in is too expensive you can do this over the counter trick.  Apply a pea size amount of generic prescription Anusol HC cream that has been sent into your pharmacy to the tip of an over the counter PrepH suppository and insert rectally once every night for at least 7 nights.  If this does not improve there are procedures that can be done.   About Hemorrhoids  Hemorrhoids are swollen veins in the lower rectum and anus.  Also called piles, hemorrhoids are a common problem.  Hemorrhoids may be internal (inside the rectum) or external (around the anus).  Internal Hemorrhoids  Internal hemorrhoids are often painless, but they rarely cause bleeding.  The internal veins may stretch and fall down (prolapse) through the anus to the outside of the body.  The veins may then become irritated and  painful.  External Hemorrhoids  External hemorrhoids can be easily seen or felt around the anal opening.  They are under the skin around the anus.  When the swollen veins are scratched or broken by straining, rubbing or wiping they sometimes bleed.  How Hemorrhoids Occur  Veins in the rectum and around the anus tend to swell under pressure.  Hemorrhoids can result from increased pressure in the veins of your anus or rectum.  Some sources of pressure are:  Straining to have a bowel movement because of constipation Waiting too long to have a bowel movement Coughing and sneezing often Sitting for extended periods of time, including on the toilet Diarrhea Obesity Trauma or injury to the anus Some liver diseases Stress Family history of hemorrhoids Pregnancy  Pregnant women should try to avoid becoming constipated, because they are more likely to have hemorrhoids during pregnancy.  In the last trimester of pregnancy, the enlarged uterus may press on blood vessels and causes hemorrhoids.  In addition, the strain of childbirth sometimes causes hemorrhoids after the birth.  Symptoms of Hemorrhoids  Some symptoms of hemorrhoids include: Swelling and/or a tender lump around the anus Itching, mild burning and bleeding around the anus Painful bowel movements with or without constipation Bright red blood covering the stool, on toilet paper or in the toilet bowel.   Symptoms usually go away within a few days.  Always talk to your doctor about  any bleeding to make sure it is not from some other causes.  Diagnosing and Treating Hemorrhoids  Diagnosis is made by an examination by your healthcare provider.  Special test can be performed by your doctor.    Most cases of hemorrhoids can be treated with: High-fiber diet: Eat more high-fiber foods, which help prevent constipation.  Ask for more detailed fiber information on types and sources of fiber from your healthcare provider. Fluids: Drink  plenty of water.  This helps soften bowel movements so they are easier to pass. Sitz baths and cold packs: Sitting in lukewarm water two or three times a day for 15 minutes cleases the anal area and may relieve discomfort.  If the water is too hot, swelling around the anus will get worse.  Placing a cloth-covered ice pack on the anus for ten minutes four times a day can also help reduce selling.  Gently pushing a prolapsed hemorrhoid back inside after the bath or ice pack can be helpful. Medications: For mild discomfort, your healthcare provider may suggest over-the-counter pain medication or prescribe a cream or ointment for topical use.  The cream may contain witch hazel, zinc oxide or petroleum jelly.  Medicated suppositories are also a treatment option.  Always consult your doctor before applying medications or creams. Procedures and surgeries: There are also a number of procedures and surgeries to shrink or remove hemorrhoids in more serious cases.  Talk to your physician about these options.  You can often prevent hemorrhoids or keep them from becoming worse by maintaining a healthy lifestyle.  Eat a fiber-rich diet of fruits, vegetables and whole grains.  Also, drink plenty of water and exercise regularly.   2007, [employer] Doc.30  Please take your proton pump inhibitor medication, protonix daily  Please take this medication 30 minutes to 1 hour before meals- this makes it more effective.  Avoid spicy and acidic foods Avoid fatty foods Limit your intake of coffee, tea, alcohol, and carbonated drinks Work to maintain a healthy weight Keep the head of the bed elevated at least 3 inches with blocks or a wedge pillow if you are having any nighttime symptoms Stay upright for 2 hours after eating Avoid meals and snacks three to four hours before bedtime   If you use inhalers (even only as needed), please bring them with you on the day of your procedure.  If you take any of the  following medications, they will need to be adjusted prior to your procedure:   DO NOT TAKE 7 DAYS PRIOR TO TEST- Trulicity (dulaglutide) Ozempic, Wegovy (semaglutide) Mounjaro (tirzepatide) Bydureon Bcise (exanatide extended release)  DO NOT TAKE 1 DAY PRIOR TO YOUR TEST Rybelsus (semaglutide) Adlyxin (lixisenatide) Victoza (liraglutide) Byetta (exanatide) ___________________________________________________________________________   If your blood pressure at your visit was 140/90 or greater, please contact your primary care physician to follow up on this.  _______________________________________________________  If you are age 68 or older, your body mass index should be between 23-30. Your Body mass index is 25.46 kg/m. If this is out of the aforementioned range listed, please consider follow up with your Primary Care Provider.  If you are age 87 or younger, your body mass index should be between 19-25. Your Body mass index is 25.46 kg/m. If this is out of the aformentioned range listed, please consider follow up with your Primary Care Provider.   ________________________________________________________  The [facility] providers would like to encourage you to use [facility] to communicate with providers for non-urgent requests  or questions.  Due to long hold times on the telephone, sending your provider a message by [facility] may be a faster and more efficient way to get a response.  Please allow 48 business hours for a response.  Please remember that this is for non-urgent requests.  _______________________________________________________ It was a pleasure to see you today!  Thank you for trusting me with your gastrointestinal care!

## 2023-06-15 NOTE — Progress Notes (Signed)
06/15/2023 DAVINEE GRENNAN [id number] 05/01/1986  Referring provider: Garnette Gunner, MD Primary GI doctor: Dr. Tomasa Rand  ASSESSMENT AND PLAN:   H. Pylori Infection Positive breath test in June 2024. Treated with clarithromycin and possibly omeprazole, but unclear if full eradication regimen was completed. No eradication study.  -Schedule upper endoscopy on 06/19/2023 to obtain biopsy for H. Pylori and evaluate for complications and possible dilatation with dysphagia. I discussed risks of EGD with patient today, including risk of sedation, bleeding or perforation.  Patient provides understanding and gave verbal consent to proceed.  Gastroesophageal Reflux Disease (GERD) Reports of epigastric fullness and reflux, particularly when lying down. Symptoms improved with PPI therapy. -Continue PPI (pantoprazole). - GERD lifestyle changes discussed with the patient.   Dysphagia Reports of occasional difficulty swallowing pills, relieved with water. -Evaluate during scheduled upper endoscopy with possible dilatation on 06/19/2023.  Hemorrhoids Reports of occasional rectal bleeding with hard stools, likely due to internal hemorrhoids confirmed on rectal exam. -Provide suppository for hemorrhoid treatment. -Advise on Miralax and fiber for constipation.  Colonoscopy Discussed need for colonoscopy due to rectal bleeding and family history of benign polyps. Patient concerned about insurance coverage. -Defer colonoscopy for now, but consider in future if rectal bleeding persists or worsens after hemorrhoid treatment and improved bowel movements.       Patient Care Team: Garnette Gunner, MD as PCP - General (Family Medicine) Drema Dallas, DO as Consulting Physician (Neurology)  HISTORY OF PRESENT ILLNESS: 37 y.o. female with a past medical history of IIH, GERD, H pylori, B12 def and others listed below presents for evaluation of GERD.   Discussed the use of AI scribe software for  clinical note transcription with the patient, who gave verbal consent to proceed.  The patient, with a history of H pylori infection, presents with persistent symptoms of epigastric fullness, constipation, and reflux. They describe the fullness as a sensation of being 'filled up', which sometimes makes them feel like they are unable to breathe. This feeling is constant and is not affected by their position or activity level.  The patient also experiences reflux, particularly when lying down, describing a sensation of food 'pushing back up'. They also report occasional difficulty swallowing pills, which they feel get stuck, but this is usually resolved with a few sips of water, this has gotten worse.   The patient also reports having difficulty with bowel movements, describing periods of constipation where they would go a week without passing stool. This has improved recently with medication for the H pylori, but they still experience occasional episodes of feeling full. The patient has noticed occasional rectal bleeding, rare with straining very small voluem, which they attribute to hemorrhoids caused by their previous constipation and history of twins 12 years ago. They also report occasional dark stools, but are unsure if they are truly black. No iron, no pepto.   The patient has a history of back problems and has been taking ibuprofen for pain relief. They also mention having fibroids and being on medication for migraines/IIH. Has seen pulmonary for SOB but this has resolved with PPI and likely secondary to LPR. .      She denies blood thinner use.  She denies NSAID use.  She denies ETOH use.   She denies tobacco use.  She denies drug use.    She  reports that she has never smoked. She has never been exposed to tobacco smoke. She does not have any smokeless tobacco history  on file. She reports that she does not drink alcohol and does not use drugs.  RELEVANT LABS AND IMAGING:  Results    LABS H. pylori breath test: Positive (03/13/2023) Hemoglobin (Hb): 15.1 g/dL (37/06/6268) White Blood Cell count (WBC): Normal (03/13/2023) Platelet count (PLT): Normal (03/13/2023) Renal function: Normal (03/13/2023) Hepatic function: Normal (03/13/2023)  DIAGNOSTIC Rectal exam: Internal hemorrhoids, no external hemorrhoids, no fissure, no masses, negative for bleeding (06/15/2023)      CBC    Component Value Date/Time   WBC 6.6 06/05/2023 0945   RBC 5.63 (H) 06/05/2023 0945   HGB 15.1 (H) 06/05/2023 0945   HCT 48.2 (H) 06/05/2023 0945   PLT 294.0 06/05/2023 0945   MCV 85.6 06/05/2023 0945   MCH 25.7 (L) 02/17/2011 0140   MCHC 31.4 06/05/2023 0945   RDW 13.9 06/05/2023 0945   LYMPHSABS 1.7 06/05/2023 0945   MONOABS 0.4 06/05/2023 0945   EOSABS 0.0 06/05/2023 0945   BASOSABS 0.0 06/05/2023 0945   Recent Labs    10/31/22 1020 11/28/22 1100 06/05/23 0945  HGB 15.7* 15.2* 15.1*    CMP     Component Value Date/Time   NA 138 06/05/2023 0945   K 3.9 06/05/2023 0945   CL 106 06/05/2023 0945   CO2 22 06/05/2023 0945   GLUCOSE 78 06/05/2023 0945   BUN 13 06/05/2023 0945   CREATININE 0.83 06/05/2023 0945   CALCIUM 8.9 06/05/2023 0945   PROT 7.0 06/05/2023 0945   ALBUMIN 4.2 06/05/2023 0945   AST 15 06/05/2023 0945   ALT 17 06/05/2023 0945   ALKPHOS 54 06/05/2023 0945   BILITOT 0.5 06/05/2023 0945   GFRNONAA >60 02/17/2011 0140   GFRAA  02/17/2011 0140    >60        The eGFR has been calculated using the MDRD equation. This calculation has not been validated in all clinical situations. eGFR's persistently <60 mL/min signify possible Chronic Kidney Disease.      Latest Ref Rng & Units 06/05/2023    9:45 AM 11/28/2022   11:00 AM 10/31/2022   10:20 AM  Hepatic Function  Total Protein 6.0 - 8.3 g/dL 7.0  7.0  7.2   Albumin 3.5 - 5.2 g/dL 4.2  4.2  4.5   AST 0 - 37 U/L 15  16  15    ALT 0 - 35 U/L 17  21  18    Alk Phosphatase 39 - 117 U/L 54  51  60   Total  Bilirubin 0.2 - 1.2 mg/dL 0.5  0.4  0.5   Bilirubin, Direct 0.0 - 0.3 mg/dL  0.1        Current Medications:    Current Outpatient Medications (Cardiovascular):    acetaZOLAMIDE (DIAMOX) 250 MG tablet, Take 1 tablet (250 mg total) by mouth daily.   rosuvastatin (CRESTOR) 20 MG tablet, Take 1 tablet (20 mg total) by mouth daily.     Current Outpatient Medications (Other):    Cholecalciferol (VITAMIN D3) 50 MCG (2000 UT) capsule, Take 1 capsule (2,000 Units total) by mouth daily.   hydrocortisone (ANUSOL-HC) 2.5 % rectal cream, Place 1 Application rectally 2 (two) times daily.   hydrocortisone (ANUSOL-HC) 25 MG suppository, Place 1 suppository (25 mg total) rectally 2 (two) times daily.   Multiple Vitamins-Minerals (MULTIVITAMIN WITH MINERALS) tablet, Take 1 tablet by mouth daily.   pantoprazole (PROTONIX) 40 MG tablet, Take 1 tablet (40 mg total) by mouth daily. Take 30-60 min before first meal of the day   Vitamin D,  Ergocalciferol, (DRISDOL) 1.25 MG (50000 UNIT) CAPS capsule, Take 1 capsule (50,000 Units total) by mouth every 7 (seven) days.  Medical History:  Past Medical History:  Diagnosis Date   GERD (gastroesophageal reflux disease)    IIH (idiopathic intracranial hypertension)    Papilledema    Pneumonia    Allergies: No Known Allergies   Surgical History:  She  has no past surgical history on file. Family History:  Her family history includes Diabetes in her father and mother; Hyperlipidemia in her mother; Hypertension in her mother; Ovarian cysts in her mother.  REVIEW OF SYSTEMS  : All other systems reviewed and negative except where noted in the History of Present Illness.  PHYSICAL EXAM: BP 120/80   Pulse 84   Wt 153 lb (69.4 kg)   LMP 06/05/2023   BMI 25.46 kg/m  Physical Exam   HEENT: Thyroid gland normal on palpation. NECK: No lymphadenopathy. CHEST: Lungs clear to auscultation bilaterally. CARDIOVASCULAR: Heart sounds normal with regular  rhythm. ABDOMEN: Bowel sounds present without discomfort on palpation. RECTAL: No external hemorrhoids observed, presence of internal hemorrhoids noted without masses. brown stool, negative hemoccult EXTREMITIES: No edema in legs.        Doree Albee, PA-C 2:29 PM

## 2023-06-16 ENCOUNTER — Encounter: Payer: Self-pay | Admitting: Gastroenterology

## 2023-06-19 ENCOUNTER — Ambulatory Visit (AMBULATORY_SURGERY_CENTER): Payer: 59 | Admitting: Gastroenterology

## 2023-06-19 ENCOUNTER — Encounter: Payer: Self-pay | Admitting: Gastroenterology

## 2023-06-19 VITALS — BP 133/87 | HR 78 | Temp 98.6°F | Resp 17 | Ht 65.0 in | Wt 153.0 lb

## 2023-06-19 DIAGNOSIS — B9681 Helicobacter pylori [H. pylori] as the cause of diseases classified elsewhere: Secondary | ICD-10-CM | POA: Diagnosis not present

## 2023-06-19 DIAGNOSIS — K2970 Gastritis, unspecified, without bleeding: Secondary | ICD-10-CM

## 2023-06-19 DIAGNOSIS — K219 Gastro-esophageal reflux disease without esophagitis: Secondary | ICD-10-CM | POA: Diagnosis not present

## 2023-06-19 DIAGNOSIS — K2950 Unspecified chronic gastritis without bleeding: Secondary | ICD-10-CM

## 2023-06-19 MED ORDER — SODIUM CHLORIDE 0.9 % IV SOLN: 500.0000 mL | Freq: Once | INTRAVENOUS | Status: DC

## 2023-06-19 NOTE — Progress Notes (Signed)
Pt's states no medical or surgical changes since previsit or office visit. VS assessed by K.P ?

## 2023-06-19 NOTE — Progress Notes (Signed)
Report to PACU, RN, vss, BBS= Clear.  

## 2023-06-19 NOTE — Progress Notes (Signed)
Called to room to assist during endoscopic procedure.  Patient ID and intended procedure confirmed with present staff. Received instructions for my participation in the procedure from the performing physician.  

## 2023-06-19 NOTE — Progress Notes (Signed)
History and Physical Interval Note:  06/19/2023 11:25 AM  Caroline Charles  has presented today for endoscopic procedure(s), with the diagnosis of  Encounter Diagnosis  Name Primary?   Gastroesophageal reflux disease without esophagitis Yes  .  The various methods of evaluation and treatment have been discussed with the patient and/or family. After consideration of risks, benefits and other options for treatment, the patient has consented to  the endoscopic procedure(s).   The patient's history has been reviewed, patient examined, no change in status, stable for endoscopic procedure(s).  I have reviewed the patient's chart and labs.  Questions were answered to the patient's satisfaction.     Lynanne Delgreco E. Tomasa Rand, MD [facility]

## 2023-06-19 NOTE — Patient Instructions (Addendum)
- 

## 2023-06-19 NOTE — Op Note (Signed)
[facility] Patient Name: Caroline Charles Procedure Date: 06/19/2023 11:23 AM MRN: [id number] Endoscopist: Lorin Picket E. Tomasa Rand , MD, [id number] Age: 37 Referring MD:  Date of Birth: 29-Sep-1985 Gender: Female Account #: [id number] Procedure:                Upper GI endoscopy Indications:              Dysphagia (resolved with PPI), Follow-up of                            Helicobacter pylori (previously treated) Medicines:                Monitored Anesthesia Care Procedure:                Pre-Anesthesia Assessment:                           - Prior to the procedure, a History and Physical                            was performed, and patient medications and                            allergies were reviewed. The patient's tolerance of                            previous anesthesia was also reviewed. The risks                            and benefits of the procedure and the sedation                            options and risks were discussed with the patient.                            All questions were answered, and informed consent                            was obtained. Prior Anticoagulants: The patient has                            taken no anticoagulant or antiplatelet agents. ASA                            Grade Assessment: II - A patient with mild systemic                            disease. After reviewing the risks and benefits,                            the patient was deemed in satisfactory condition to                            undergo the procedure.  After obtaining informed consent, the endoscope was                            passed under direct vision. Throughout the                            procedure, the patient's blood pressure, pulse, and                            oxygen saturations were monitored continuously. The                            GIF [id number] #[id number] was introduced through the                            mouth, and advanced  to the second part of duodenum.                            The upper GI endoscopy was accomplished without                            difficulty. The patient tolerated the procedure                            well. Scope In: Scope Out: Findings:                 The examined portions of the nasopharynx,                            oropharynx and larynx were normal.                           The examined esophagus was normal.                           Diffuse atrophic mucosa was found in the gastric                            antrum. Biopsies were taken with a cold forceps for                            Helicobacter pylori testing. Estimated blood loss                            was minimal.                           The exam of the stomach was otherwise normal.                           The examined duodenum was normal. Complications:            No immediate complications. Estimated Blood Loss:     Estimated blood loss was minimal. Impression:               -  The examined portions of the nasopharynx,                            oropharynx and larynx were normal.                           - Normal esophagus.                           - Gastric mucosal atrophy. Biopsied.                           - Normal examined duodenum. Recommendation:           - Patient has a contact number available for                            emergencies. The signs and symptoms of potential                            delayed complications were discussed with the                            patient. Return to normal activities tomorrow.                            Written discharge instructions were provided to the                            patient.                           - Resume previous diet.                           - Continue present medications.                           - Await pathology results. [name] E. Tomasa Rand, MD 06/19/2023 11:50:56 AM This report has been signed electronically.

## 2023-06-19 NOTE — Progress Notes (Signed)
Agree with the assessment and plan as outlined by Amanda Collier,  PA-C.  Scott E. Cunningham, MD  

## 2023-06-20 ENCOUNTER — Telehealth: Payer: Self-pay | Admitting: *Deleted

## 2023-06-20 NOTE — Telephone Encounter (Signed)
  Follow up Call-     06/19/2023   10:50 AM  Call back number  Post procedure Call Back phone  # [phone]  Permission to leave phone message Yes     Patient questions:  Do you have a fever, pain , or abdominal swelling? No. Pain Score  0 *  Have you tolerated food without any problems? Yes.    Have you been able to return to your normal activities? Yes.    Do you have any questions about your discharge instructions: Diet   No. Medications  No. Follow up visit  No.  Do you have questions or concerns about your Care? No.  Actions: * If pain score is 4 or above: No action needed, pain <4.

## 2023-06-21 ENCOUNTER — Telehealth: Payer: Self-pay | Admitting: Family Medicine

## 2023-06-21 ENCOUNTER — Emergency Department (HOSPITAL_COMMUNITY): Payer: 59

## 2023-06-21 ENCOUNTER — Emergency Department (HOSPITAL_COMMUNITY)
Admission: EM | Admit: 2023-06-21 | Discharge: 2023-06-21 | Disposition: A | Discharge status: Home / Self Care | Payer: 59 | Attending: Emergency Medicine | Admitting: Emergency Medicine

## 2023-06-21 ENCOUNTER — Encounter (HOSPITAL_COMMUNITY): Payer: Self-pay

## 2023-06-21 ENCOUNTER — Other Ambulatory Visit: Payer: Self-pay

## 2023-06-21 DIAGNOSIS — R1010 Upper abdominal pain, unspecified: Secondary | ICD-10-CM | POA: Diagnosis not present

## 2023-06-21 DIAGNOSIS — R079 Chest pain, unspecified: Secondary | ICD-10-CM | POA: Diagnosis not present

## 2023-06-21 DIAGNOSIS — K219 Gastro-esophageal reflux disease without esophagitis: Secondary | ICD-10-CM | POA: Diagnosis not present

## 2023-06-21 DIAGNOSIS — R140 Abdominal distension (gaseous): Secondary | ICD-10-CM | POA: Insufficient documentation

## 2023-06-21 DIAGNOSIS — R1013 Epigastric pain: Secondary | ICD-10-CM | POA: Diagnosis not present

## 2023-06-21 LAB — CBC
HCT: 48.6 % — ABNORMAL HIGH (ref 36.0–46.0)
Hemoglobin: 15.3 g/dL — ABNORMAL HIGH (ref 12.0–15.0)
MCH: 27.2 pg (ref 26.0–34.0)
MCHC: 31.5 g/dL (ref 30.0–36.0)
MCV: 86.3 fL (ref 80.0–100.0)
Platelets: 284 10*3/uL (ref 150–400)
RBC: 5.63 MIL/uL — ABNORMAL HIGH (ref 3.87–5.11)
RDW: 13 % (ref 11.5–15.5)
WBC: 7.4 10*3/uL (ref 4.0–10.5)
nRBC: 0 % (ref 0.0–0.2)

## 2023-06-21 LAB — URINALYSIS, ROUTINE W REFLEX MICROSCOPIC
Bilirubin Urine: NEGATIVE
Glucose, UA: NEGATIVE mg/dL
Hgb urine dipstick: NEGATIVE
Ketones, ur: 5 mg/dL — AB
Leukocytes,Ua: NEGATIVE
Nitrite: NEGATIVE
Protein, ur: NEGATIVE mg/dL
Specific Gravity, Urine: 1.005 (ref 1.005–1.030)
pH: 8 (ref 5.0–8.0)

## 2023-06-21 LAB — SURGICAL PATHOLOGY

## 2023-06-21 LAB — COMPREHENSIVE METABOLIC PANEL
ALT: 19 U/L (ref 0–44)
AST: 17 U/L (ref 15–41)
Albumin: 4.4 g/dL (ref 3.5–5.0)
Alkaline Phosphatase: 60 U/L (ref 38–126)
Anion gap: 8 (ref 5–15)
BUN: 13 mg/dL (ref 6–20)
CO2: 23 mmol/L (ref 22–32)
Calcium: 9.1 mg/dL (ref 8.9–10.3)
Chloride: 105 mmol/L (ref 98–111)
Creatinine, Ser: 0.77 mg/dL (ref 0.44–1.00)
GFR, Estimated: >60 mL/min (ref >60)
Glucose, Bld: 87 mg/dL (ref 70–99)
Potassium: 3.5 mmol/L (ref 3.5–5.1)
Sodium: 136 mmol/L (ref 135–145)
Total Bilirubin: 0.7 mg/dL (ref 0.3–1.2)
Total Protein: 7.7 g/dL (ref 6.5–8.1)

## 2023-06-21 LAB — LIPASE, BLOOD: Lipase: 25 U/L (ref 11–51)

## 2023-06-21 LAB — HCG, SERUM, QUALITATIVE: Preg, Serum: NEGATIVE

## 2023-06-21 MED ORDER — IOHEXOL 300 MG/ML  SOLN
75.0000 mL | Freq: Once | INTRAMUSCULAR | Status: AC | PRN
  Administered 2023-06-21: 75 mL via INTRAVENOUS

## 2023-06-21 NOTE — Telephone Encounter (Signed)
Caller Name: Ayani Ospina Ph #: [phone] Chief Complaint: Pt has been experiencing dizziness and lightheadedness she took her BP it was running high. This am at 7:50 it was 158/123  This call was transferred to Nurse Triage/Access Nurse. This is for documentation purposes. No follow up required at this time.

## 2023-06-21 NOTE — ED Triage Notes (Signed)
Pt complaints of epigastric pressure/discomfort that started yesterday. Pt reports that they had endoscopic procedure Monday. Last bowel movement this morning. Hx of H.pylori and GERD. Pt reports taking pantoprazole this morning.

## 2023-06-21 NOTE — ED Provider Notes (Signed)
Endoscopy (h/o H. Pylori) x 2 days ago, now having severe epigastric pain x 1 day Pending CT abd Physical Exam  BP (!) 154/105   Pulse 80   Temp 98.5 F (36.9 C) (Oral)   Resp 16   Ht 5\' 5"  (1.651 m)   Wt 68 kg   LMP 06/05/2023   SpO2 100%   BMI 24.96 kg/m   Physical Exam Awake, alert, in NAD and comfortable appearing.   Procedures  Procedures  ED Course / MDM    Medical Decision Making Amount and/or Complexity of Data Reviewed Labs: ordered. Radiology: ordered.  Risk Prescription drug management.   PLAN: If CT negative, can discharge home.  CT reviewed and interpreted by radiology as negative for acute process.  EKG reviewed and shows a NSR CXR reviewed and is clear - no infiltrates or consolidations. No edema. No evidence to suggest free air/perforation. Labs reviewed and are stable with essential abnormality.  She can be discharged home. Medications reviewed with the patient. Encouraged her to increase Pepcid to BID dosing and schedule a follow up appointment with PCP next week. Discussed return precautions.   Dx: 1) Epigastric abdominal pain       Elpidio Anis, PA-C 06/21/23 1634    Bethann Berkshire, MD 06/22/23 [phone]

## 2023-06-21 NOTE — Discharge Instructions (Signed)
Increase your Pepcid to one tablet twice a day. Follow up with your doctor next week for recheck to ensure the epigastric discomfort is improving or resolved.

## 2023-06-21 NOTE — ED Provider Notes (Signed)
[place] EMERGENCY DEPARTMENT AT [facility] Provider Note   CSN: [id number] Arrival date & time: 06/21/23  5176     History  Chief Complaint  Patient presents with   Abdominal Pain    Caroline Charles is a 37 y.o. female with a past medical history significant for idiopathic intracranial hypertension, GERD, history of H. pylori who presents to the ED due to upper abdominal pain and distention after endoscopy that occurred on 9/30.  Patient states she left procedure feeling fine and then started to develop abdominal distention and pain yesterday.  Admits to epigastric pain.  No nausea, vomiting, or diarrhea.  Denies chest pain and shortness of breath.  Is concerned because whenever she feels the distention and pain she feels like her BP is elevated.  Endoscopy was mostly normal except for diffuse atrophic mucosa in gastric antrum where biopsies were obtained for H. pylori.  Everything else was unremarkable.  History obtained from patient and past medical records. No interpreter used during encounter.       Home Medications Prior to Admission medications   Medication Sig Start Date End Date Taking? Authorizing Provider  acetaZOLAMIDE (DIAMOX) 250 MG tablet Take 1 tablet (250 mg total) by mouth daily. 04/10/23   Drema Dallas, DO  Cholecalciferol (VITAMIN D3) 50 MCG (2000 UT) capsule Take 1 capsule (2,000 Units total) by mouth daily. 06/05/23 05/30/24  Garnette Gunner, MD  famotidine (PEPCID) 20 MG tablet Take 20 mg by mouth daily. 06/19/23   [provider]  hydrocortisone (ANUSOL-HC) 2.5 % rectal cream Place 1 Application rectally 2 (two) times daily. 06/15/23   Doree Albee, PA-C  hydrocortisone (ANUSOL-HC) 25 MG suppository Place 1 suppository (25 mg total) rectally 2 (two) times daily. 06/15/23   Doree Albee, PA-C  Multiple Vitamins-Minerals (MULTIVITAMIN WITH MINERALS) tablet Take 1 tablet by mouth daily. 11/28/22   Garnette Gunner, MD  pantoprazole  (PROTONIX) 40 MG tablet Take 1 tablet (40 mg total) by mouth daily. Take 30-60 min before first meal of the day 05/26/23   Nyoka Cowden, MD  rosuvastatin (CRESTOR) 20 MG tablet Take 1 tablet (20 mg total) by mouth daily. 06/07/23   Garnette Gunner, MD  Vitamin D, Ergocalciferol, (DRISDOL) 1.25 MG (50000 UNIT) CAPS capsule Take 1 capsule (50,000 Units total) by mouth every 7 (seven) days. 06/05/23   Garnette Gunner, MD      Allergies    Patient has no known allergies.    Review of Systems   Review of Systems  Respiratory:  Negative for shortness of breath.   Cardiovascular:  Negative for chest pain.  Gastrointestinal:  Positive for abdominal distention and abdominal pain. Negative for diarrhea, nausea and vomiting.    Physical Exam Updated Vital Signs BP (!) 154/105   Pulse 80   Temp 98.5 F (36.9 C) (Oral)   Resp 16   Ht 5\' 5"  (1.651 m)   Wt 68 kg   LMP 06/05/2023   SpO2 100%   BMI 24.96 kg/m  Physical Exam Vitals and nursing note reviewed.  Constitutional:      General: She is not in acute distress.    Appearance: She is not ill-appearing.  HENT:     Head: Normocephalic.  Eyes:     Pupils: Pupils are equal, round, and reactive to light.  Cardiovascular:     Rate and Rhythm: Normal rate and regular rhythm.     Pulses: Normal pulses.  Heart sounds: Normal heart sounds. No murmur heard.    No friction rub. No gallop.  Pulmonary:     Effort: Pulmonary effort is normal.     Breath sounds: Normal breath sounds.  Abdominal:     General: Abdomen is flat. There is no distension.     Palpations: Abdomen is soft.     Tenderness: There is abdominal tenderness. There is no guarding or rebound.  Musculoskeletal:        General: Normal range of motion.     Cervical back: Neck supple.  Skin:    General: Skin is warm and dry.  Neurological:     General: No focal deficit present.     Mental Status: She is alert.  Psychiatric:        Mood and Affect: Mood normal.         Behavior: Behavior normal.     ED Results / Procedures / Treatments   Labs (all labs ordered are listed, but only abnormal results are displayed) Labs Reviewed  CBC - Abnormal; Notable for the following components:      Result Value   RBC 5.63 (*)    Hemoglobin 15.3 (*)    HCT 48.6 (*)    All other components within normal limits  URINALYSIS, ROUTINE W REFLEX MICROSCOPIC - Abnormal; Notable for the following components:   Color, Urine STRAW (*)    Ketones, ur 5 (*)    All other components within normal limits  LIPASE, BLOOD  COMPREHENSIVE METABOLIC PANEL  HCG, SERUM, QUALITATIVE    EKG None  Radiology DG Chest 2 View  Result Date: 06/21/2023 CLINICAL DATA:  Chest pain EXAM: CHEST - 2 VIEW COMPARISON:  11/16/2022 FINDINGS: The heart size and mediastinal contours are within normal limits. Both lungs are clear. The visualized skeletal structures are unremarkable. IMPRESSION: No active cardiopulmonary disease. Electronically Signed   By: Judie Petit.  Shick M.D.   On: 06/21/2023 14:26    Procedures Procedures    Medications Ordered in ED Medications - No data to display  ED Course/ Medical Decision Making/ A&P                                 Medical Decision Making Amount and/or Complexity of Data Reviewed Labs: ordered. Decision-making details documented in ED Course. Radiology: ordered and independent interpretation performed. Decision-making details documented in ED Course.   This patient presents to the ED for concern of upper abdominal pain/distention, this involves an extensive number of treatment options, and is a complaint that carries with it a high risk of complications and morbidity.  The differential diagnosis includes perforation, pancreatitis, gastritis, etc  37 year old female presents to the ED due to epigastric pain and distention after EGD on 9/30.  No nausea, vomiting, or diarrhea.  Denies chest pain and shortness of breath.  Patient concerned because BP is  elevated due to pain and distention.  Upon arrival patient's BP 181/116 which improved to 150/105 during initial evaluation.  Patient in no acute distress.  Abdomen soft, nondistended with very mild epigastric tenderness.  Given subjective distention and abdominal pain will obtain CT abdomen to rule out evidence of perforation or other abnormalities.  Routine labs ordered.  CBC with elevated hemoglobin 15.3.  No leukocytosis.  Lipase normal.  Low suspicion for pancreatitis.  CMP unremarkable.  Normal renal function.  No major electrolyte derangements.  UA negative for signs infection.  Pregnancy test negative.  Chest x-ray personally viewed and interpreted as negative for signs of pneumonia, pneumothorax, or widened mediastinum.  No evidence of pneumomediastinum. EKG NSR, no signs of acute ischemia.  Patient handed off to Elpidio Anis, PA-C at shift change pending CT scan. If normal, patient may be discharged home with GI follow-up.  Lives at home Has PCP Hx H. pylori       Final Clinical Impression(s) / ED Diagnoses Final diagnoses:  Pain of upper abdomen  Abdominal distention    Rx / DC Orders ED Discharge Orders     None         Mannie Stabile, PA-C 06/21/23 1500    Bethann Berkshire, MD 06/22/23 [phone]

## 2023-06-22 NOTE — Progress Notes (Signed)
Caroline Charles,  The biopsies taken from your stomach were notable for mild chronic gastritis (inflammation) which is a common finding, but there was no evidence of Helicobacter pylori infection. This indicates that the bacteria was successfully eradicated with the antibiotics.  No further treatment is necessary.  Please follow-up in our office as needed for any ongoing chronic GI symptoms.

## 2023-06-23 ENCOUNTER — Telehealth: Payer: Self-pay | Admitting: Gastroenterology

## 2023-06-23 NOTE — Telephone Encounter (Signed)
Pt states she had EGD done on 9/30. She was called by [facility] RN to see how she was doing. Pt told her she had a lot of bloating and gas in her abdomen. She has been taking Protonix 40mg  daily. Pt called PCP stating she was having some dizziness, she took her BP at home and BP was quite high. She was instructed to go to the ER. BP was 154/105  at the ER. She had a CT of A/P that was negative and she was instructed to take pepcid 20mg  BID. States she is still having lots of bloating. Please advise.

## 2023-06-23 NOTE — Telephone Encounter (Signed)
Inbound call from patient stating after 9/30 endoscopy she has been very bloated with air. Patient stated she went to the ED on 10/2 and was prescribed pepcid. Patient states even after taking medication she has still be bloated and feeling very full after she eats. Patient is requesting a call back to discuss further and other recommendations. Please advise, thank you

## 2023-06-26 ENCOUNTER — Encounter: Payer: Self-pay | Admitting: Family Medicine

## 2023-06-26 ENCOUNTER — Ambulatory Visit (INDEPENDENT_AMBULATORY_CARE_PROVIDER_SITE_OTHER): Payer: 59 | Admitting: Family Medicine

## 2023-06-26 VITALS — BP 136/80 | HR 80 | Temp 97.5°F | Wt 151.0 lb

## 2023-06-26 DIAGNOSIS — M542 Cervicalgia: Secondary | ICD-10-CM | POA: Diagnosis not present

## 2023-06-26 DIAGNOSIS — K2100 Gastro-esophageal reflux disease with esophagitis, without bleeding: Secondary | ICD-10-CM | POA: Diagnosis not present

## 2023-06-26 MED ORDER — PANTOPRAZOLE SODIUM 40 MG PO TBEC
DELAYED_RELEASE_TABLET | ORAL | 2 refills | Status: DC
Start: 2023-06-26 — End: 2023-06-27

## 2023-06-26 MED ORDER — FAMOTIDINE 20 MG PO TABS: 40.0000 mg | ORAL_TABLET | Freq: Every evening | ORAL | 0 refills | Status: DC | PRN

## 2023-06-26 MED ORDER — SIMETHICONE 125 MG PO CAPS
ORAL_CAPSULE | ORAL | 0 refills | Status: DC
Start: 2023-06-26 — End: 2023-08-24

## 2023-06-26 NOTE — Telephone Encounter (Signed)
See mychart response to patient.

## 2023-06-26 NOTE — Patient Instructions (Signed)
Pantoprazole: Take 40 milligrams twice daily, 30 minutes before breakfast and dinner. Famotidine: Use 40 milligrams at night if you experience breakthrough heartburn. Simethicone (GasX): Continue taking for bloating as needed, ensuring not to exceed the maximum recommended dosage. Follow-Up: Schedule a follow-up appointment with your gastroenterologist for further evaluation.

## 2023-06-26 NOTE — Assessment & Plan Note (Signed)
Poorly controlled.  Increase pantoprazole to 40 mg twice daily. Famotidine 40 mg as needed for breakthrough symptoms. Continue simethicone (GasX) for bloating. Follow up with gastroenterologist for potential further interventions, including consideration of fundoplication or advanced potassium channel blockers such as vonoprazan. Reevaluate blood pressure in the context of addressing discomfort.

## 2023-06-26 NOTE — Progress Notes (Signed)
Assessment/Plan:   Problem List Items Addressed This Visit       Digestive   Gastroesophageal reflux disease - Primary    Poorly controlled.  Increase pantoprazole to 40 mg twice daily. Famotidine 40 mg as needed for breakthrough symptoms. Continue simethicone (GasX) for bloating. Follow up with gastroenterologist for potential further interventions, including consideration of fundoplication or advanced potassium channel blockers such as vonoprazan. Reevaluate blood pressure in the context of addressing discomfort.      Relevant Medications   pantoprazole (PROTONIX) 40 MG tablet   Simethicone 125 MG CAPS   famotidine (PEPCID) 20 MG tablet    Medications Discontinued During This Encounter  Medication Reason   pantoprazole (PROTONIX) 40 MG tablet    famotidine (PEPCID) 20 MG tablet Reorder    Return if symptoms worsen or fail to improve.    Subjective:   Encounter date: 06/26/2023  Caroline Charles is a 37 y.o. female who has COVID-19 long hauler manifesting chronic dyspnea; IIH (idiopathic intracranial hypertension); Vitamin D deficiency; Hyperlipidemia; Birth control counseling; Fibroid; Gastroesophageal reflux disease; Dyspnea; B12 deficiency; H. pylori infection; Chronic left shoulder pain; Vaginal discharge; Upper airway cough syndrome; Erythrocytosis; and Polydipsia on their problem list..   She  has a past medical history of GERD (gastroesophageal reflux disease), IIH (idiopathic intracranial hypertension), Papilledema, and Pneumonia..   Chief Complaint: Ongoing abdominal discomfort and heartburn.  History of Present Illness: Gastroesophageal Reflux Disease (GERD): The patient continues to experience significant heartburn and abdominal discomfort despite eradication of H. Pylori as confirmed by a recent endoscopy. Reflux is persistent, causing irritation from the stomach up to the throat, and potentially leading to symptoms in the lungs. Currently, the patient takes  pantoprazole 40 mg once a day and famotidine 20 mg BID prn. Despite this regimen, symptoms persist.  Review of Systems  Gastrointestinal:  Positive for heartburn. Negative for blood in stool, diarrhea, melena, nausea and vomiting.  All other systems reviewed and are negative.   No past surgical history on file.  Outpatient Medications Prior to Visit  Medication Sig Dispense Refill   acetaZOLAMIDE (DIAMOX) 250 MG tablet Take 1 tablet (250 mg total) by mouth daily. 90 tablet 1   Cholecalciferol (VITAMIN D3) 50 MCG (2000 UT) capsule Take 1 capsule (2,000 Units total) by mouth daily. 90 capsule 3   hydrocortisone (ANUSOL-HC) 2.5 % rectal cream Place 1 Application rectally 2 (two) times daily. 30 g 2   Multiple Vitamins-Minerals (MULTIVITAMIN WITH MINERALS) tablet Take 1 tablet by mouth daily.     rosuvastatin (CRESTOR) 20 MG tablet Take 1 tablet (20 mg total) by mouth daily. 90 tablet 3   famotidine (PEPCID) 20 MG tablet Take 40 mg by mouth at bedtime as needed for heartburn or indigestion.     pantoprazole (PROTONIX) 40 MG tablet Take 1 tablet (40 mg total) by mouth daily. Take 30-60 min before first meal of the day 30 tablet 2   hydrocortisone (ANUSOL-HC) 25 MG suppository Place 1 suppository (25 mg total) rectally 2 (two) times daily. (Patient not taking: Reported on 06/26/2023) 12 suppository 0   Vitamin D, Ergocalciferol, (DRISDOL) 1.25 MG (50000 UNIT) CAPS capsule Take 1 capsule (50,000 Units total) by mouth every 7 (seven) days. (Patient not taking: Reported on 06/26/2023) 5 capsule 0   No facility-administered medications prior to visit.    Family History  Problem Relation Age of Onset   Hypertension Mother    Diabetes Mother    Hyperlipidemia Mother    Ovarian cysts  Mother    Diabetes Father    Colon cancer Neg Hx    Rectal cancer Neg Hx    Stomach cancer Neg Hx    Esophageal cancer Neg Hx     Social History   Socioeconomic History   Marital status: Single    Spouse name:  Not on file   Number of children: Not on file   Years of education: Not on file   Highest education level: Not on file  Occupational History   Occupation: NAIL TE[employer]  Tobacco Use   Smoking status: Never    Passive exposure: Never   Smokeless tobacco: Not on file  Vaping Use   Vaping status: Never Used  Substance and Sexual Activity   Alcohol use: Never   Drug use: Never   Sexual activity: Not on file  Other Topics Concern   Not on file  Social History Narrative   Right handed   Drinks caffeine prn   Lives with parents and two children   Currently working   One floor home   Social Determinants of Health   Financial Resource Strain: Not on file  Food Insecurity: Not on file  Transportation Needs: Not on file  Physical Activity: Not on file  Stress: Not on file  Social Connections: Not on file  Intimate Partner Violence: Not on file                                                                                                  Objective:  Physical Exam: BP 136/80 (BP Location: Left Arm, Patient Position: Sitting, Cuff Size: Large)   Pulse 80   Temp (!) 97.5 F (36.4 C) (Temporal)   Wt 151 lb (68.5 kg)   LMP 06/05/2023   SpO2 99%   BMI 25.13 kg/m     Physical Exam Constitutional:      General: She is not in acute distress.    Appearance: Normal appearance. She is not ill-appearing or toxic-appearing.  HENT:     Head: Normocephalic and atraumatic.     Nose: Nose normal. No congestion.  Eyes:     General: No scleral icterus.    Extraocular Movements: Extraocular movements intact.  Cardiovascular:     Rate and Rhythm: Normal rate and regular rhythm.     Pulses: Normal pulses.     Heart sounds: Normal heart sounds.  Pulmonary:     Effort: Pulmonary effort is normal. No respiratory distress.     Breath sounds: Normal breath sounds.  Abdominal:     General: Abdomen is flat. Bowel sounds are normal.     Palpations: Abdomen is soft.  Musculoskeletal:         General: Normal range of motion.  Lymphadenopathy:     Cervical: No cervical adenopathy.  Skin:    General: Skin is warm and dry.     Findings: No rash.  Neurological:     General: No focal deficit present.     Mental Status: She is alert and oriented to person, place, and time. Mental status is at baseline.  Psychiatric:  Mood and Affect: Mood normal.        Behavior: Behavior normal.        Thought Content: Thought content normal.        Judgment: Judgment normal.     CT ABDOMEN PELVIS W CONTRAST  Result Date: 06/21/2023 CLINICAL DATA:  Acute upper abdominal pain and distention. Recent endoscopy. EXAM: CT ABDOMEN AND PELVIS WITH CONTRAST TECHNIQUE: Multidetector CT imaging of the abdomen and pelvis was performed using the standard protocol following bolus administration of intravenous contrast. RADIATION DOSE REDUCTION: This exam was performed according to the departmental dose-optimization program which includes automated exposure control, adjustment of the mA and/or kV according to patient size and/or use of iterative reconstruction technique. CONTRAST:  75mL OMNIPAQUE IOHEXOL 300 MG/ML  SOLN COMPARISON:  11/16/2022 FINDINGS: Lower Chest: No acute findings. Hepatobiliary: No suspicious hepatic masses identified. Gallbladder is unremarkable. No evidence of biliary ductal dilatation. Pancreas:  No mass or inflammatory changes. Spleen: Within normal limits in size and appearance. Adrenals/Urinary Tract: No suspicious masses identified. No evidence of ureteral calculi or hydronephrosis. Stomach/Bowel: No evidence of obstruction, inflammatory process or abnormal fluid collections. Normal appendix visualized. Vascular/Lymphatic: No pathologically enlarged lymph nodes. No acute vascular findings. Reproductive:  No mass or other significant abnormality. Other:  None. Musculoskeletal:  No suspicious bone lesions identified. IMPRESSION: No acute findings or other significant abnormality.  Electronically Signed   By: Danae Orleans M.D.   On: 06/21/2023 16:05   DG Chest 2 View  Result Date: 06/21/2023 CLINICAL DATA:  Chest pain EXAM: CHEST - 2 VIEW COMPARISON:  11/16/2022 FINDINGS: The heart size and mediastinal contours are within normal limits. Both lungs are clear. The visualized skeletal structures are unremarkable. IMPRESSION: No active cardiopulmonary disease. Electronically Signed   By: Judie Petit.  Shick M.D.   On: 06/21/2023 14:26    Recent Results (from the past 2160 hour(s))  Basic metabolic panel     Status: None   Collection Time: 04/10/23  2:17 PM  Result Value Ref Range   Sodium 136 135 - 145 mEq/L   Potassium 3.9 3.5 - 5.1 mEq/L   Chloride 106 96 - 112 mEq/L   CO2 23 19 - 32 mEq/L   Glucose, Bld 80 70 - 99 mg/dL   BUN 13 6 - 23 mg/dL   Creatinine, Ser 1.30 0.40 - 1.20 mg/dL   GFR 86.57 >84.69 mL/min    Comment: Calculated using the CKD-EPI Creatinine Equation (2021)   Calcium 9.3 8.4 - 10.5 mg/dL  Pulmonary function test     Status: None   Collection Time: 05/25/23  3:37 PM  Result Value Ref Range   FVC-Pre 3.15 L   FVC-%Pred-Pre 81 %   FVC-Post 3.00 L   FVC-%Pred-Post 77 %   FVC-%Change-Post -4 %   FEV1-Pre 2.62 L   FEV1-%Pred-Pre 82 %   FEV1-Post 1.98 L   FEV1-%Pred-Post 62 %   FEV1-%Change-Post -24 %   FEV6-Pre 3.15 L   FEV6-%Pred-Pre 83 %   FEV6-Post 2.96 L   FEV6-%Pred-Post 78 %   FEV6-%Change-Post -5 %   Pre FEV1/FVC ratio 83 %   FEV1FVC-%Pred-Pre 100 %   Post FEV1/FVC ratio 66 %   FEV1FVC-%Change-Post -20 %   Pre FEV6/FVC Ratio 100 %   FEV6FVC-%Pred-Pre 101 %   Post FEV6/FVC ratio 99 %   FEV6FVC-%Pred-Post 100 %   FEV6FVC-%Change-Post -1 %   FEF 25-75 Pre 3.03 L/sec   FEF2575-%Pred-Pre 92 %   FEF 25-75 Post 1.09 L/sec  FEF2575-%Pred-Post 33 %   FEF2575-%Change-Post -63 %   RV 1.36 L   RV % pred 86 %   TLC 4.52 L   TLC % pred 86 %   DLCO unc 27.87 ml/min/mmHg   DLCO unc % pred 122 %   DLCO cor 27.87 ml/min/mmHg   DLCO cor % pred  122 %   DL/VA 2.95 ml/min/mmHg/L   DL/VA % pred 621 %  Familial Hypercholesterolemia     Status: None   Collection Time: 06/05/23  9:45 AM  Result Value Ref Range   Genes Comment     Comment: 4 genes     Ethnicity Comment     Comment: Not Provided     Specimen Type Comment     Comment: Whole Blood     Indication Comment     Comment: Suspected diagnosis     Result: Comment     Comment: NEGATIVE     Interpretation Comment     Comment: No pathogenic variants or variants of uncertain significance were detected for the genes analyzed. This result does not support or rule out a diagnosis of, or a predisposition to, disorders related to the genes tested.     Recommendations Comment     Comment: If the above result is positive, genetic counseling is recommended to discuss the potential clinical and/or reproductive implications, as well as recommendations for testing family members.     Comments Comment     Comment: This interpretation is based on the clinical information provided and the current understanding of the molecular genetics of the disorder(s) tested.     Methods/Limitations Comment     Comment: Next-generation Sequencing (NGS): Genomic regions of interest are selected using the [employer](R) hybridization capture method and sequenced via the Illumina(R) NGS platform. Sequencing reads are aligned to the human genome reference GRCh37/hg19 build. Regions of interest include coding exons, intron/exon junctions (typically +/- 20 nucleotides) and additional genomic regions with known significant pathogenic variants. Estimated analytical sensitivity is >99% for single nucleotide variants and insertions/deletions <45 base pairs. [employer] and in-house algorithms identify copy number variants (CNVs) by comparing normalized read depth for each target in the region of interest with a set of clinical control samples or to the median read  depth across the samples within the same NGS run. Single exon deletions or duplications can be detected in the DMD gene with estimated overall analytical sensitivity of 96.3%. For all other genes, the assay is designed t o detect CNVs involving two or more consecutive coding exons with overall analytical sensitivity of 97.5%. Large single-exon deletions or duplications may be detected. Precise breakpoints are not reported. Confirmatory testing by orthogonal technologies may include Sanger sequencing, MLPA, gap PCR, or low coverage whole genome sequencing analysis.  If the following genes are included in this test, these analysis restrictions are applied: APOB includes only 556bp of exon 26; MED12 includes only the c.3020A>G (p.N1007S) variant; TBX1 excludes [id number] in exon 3; TTN excludes exons 154 and 155. Regions not included in CNV analysis: MYH6 exons 26 and 27, MYH7 exons 26 and 27, NF1 gene. Regions that may have lower analytical sensitivity due to intrinsic sequence properties: TGFBR exon 1.  Reported variants: Pathogenic and likely pathogenic variants are reported for all tests. Benign and likely benign variants are typically not reported. Variants of uncert ain significance are reported when included in the test specification. Variants are specified using the numbering and nomenclature recommended by the [employer] (HGVS,  [employer].cy). Variant classification and confirmation are consistent with ACMG standards and guidelines Peggye Pitt, ZOXW:[id number] Garibaldi, WJXB:[id number]). Detailed variant classification information and reevaluation are available upon request.  Limitations: Technologies used do not detect germline mosaicism and do not rule out the presence of large chromosomal aberrations including rearrangements and gene fusions, or variants in regions or genes not included in this test, or possible inter/intragenic  interactions between variants, or repeat expansions. Variant classification and/or interpretation may change over time if more information becomes available. False positive or false negative results may occur for reasons that include: rare genetic variants, sex chromosome abnormali ties, pseudogene interference, blood transfusions, bone marrow transplantation, somatic or tissue-specific mosaicism, mislabeled samples, or erroneous representation of family relationships.  This test was developed and its performance characteristics determined by [employer]. It has not been cleared or approved by the Food and Drug Administration.  [facility] [employer] is a subsidiary of [employer], using the brand [employer]. Inheritest(R) and [employer]) are registered service marks of [employer].     References Comment     Comment: Hershberger RE, Aldona Bar CY et al. Genetic evaluation of cardiomyopathy: a Veterinary surgeon of the [employer] of [employer] [facility] ([facility]). Genet Med 20, 899 (2018). PMID: 21308657  Musunuru K, Hershberger RE, Day SM et al. Genetic testing for inherited cardiovascular diseases: a scientific statement from the American Heart Association. Circ Genom Precis Med 13 (2020). PMID: 84696295     Genes Analyzed Comment     Comment: APOB,LDLR,LDLRAP1,PCSK9     Director Review/Release Comment     Comment: Component Type      Performed At         Laboratory                                          Director  Technical           [facility]     Marilynn Latino, PhD, component,          Laboratories, [place],   [facility] processing          [address]                     [place], [place],                     [address]  Technical           [facility]     Marilynn Latino, PhD, component,          Laboratories, [place],    [facility] analysis            [address],                     [place], [place],                     [address]  Professional        [facility]     [employer], component           Laboratories, [place],  PhD, [facility]                     [address],                     [place], [place],                     [address]  Electronically released by Shaune Pascal, PhD, [facility]    TSH     Status: None   Collection Time: 06/05/23  9:45 AM  Result Value Ref Range   TSH 2.64 0.35 - 5.50 uIU/mL  Hemoglobin A1c     Status: None   Collection Time: 06/05/23  9:45 AM  Result Value Ref Range   Hgb A1c MFr Bld 5.3 4.6 - 6.5 %    Comment: Glycemic Control Guidelines for People with Diabetes:Non Diabetic:  <6%Goal of Therapy: <7%Additional Action Suggested:  >8%   CBC with Differential/Platelet     Status: Abnormal   Collection Time: 06/05/23  9:45 AM  Result Value Ref Range   WBC 6.6 4.0 - 10.5 K/uL   RBC 5.63 (H) 3.87 - 5.11 Mil/uL   Hemoglobin 15.1 (H) 12.0 - 15.0 g/dL   HCT 24.4 (H) 01.0 - 27.2 %   MCV 85.6 78.0 - 100.0 fl   MCHC 31.4 30.0 - 36.0 g/dL   RDW 53.6 64.4 - 03.4 %   Platelets 294.0 150.0 - 400.0 K/uL   Neutrophils Relative % 66.6 43.0 - 77.0 %   Lymphocytes Relative 26.1 12.0 - 46.0 %   Monocytes Relative 6.2 3.0 - 12.0 %   Eosinophils Relative 0.5 0.0 - 5.0 %   Basophils Relative 0.6 0.0 - 3.0 %   Neutro Abs 4.4 1.4 - 7.7 K/uL   Lymphs Abs 1.7 0.7 - 4.0 K/uL   Monocytes Absolute 0.4 0.1 - 1.0 K/uL   Eosinophils Absolute 0.0 0.0 - 0.7 K/uL   Basophils Absolute 0.0 0.0 - 0.1 K/uL  Comprehensive metabolic panel     Status: None   Collection Time: 06/05/23  9:45 AM  Result Value Ref Range   Sodium 138 135 - 145 mEq/L   Potassium 3.9 3.5 - 5.1 mEq/L   Chloride 106 96 - 112 mEq/L   CO2 22 19 - 32 mEq/L   Glucose, Bld 78 70 - 99 mg/dL   BUN 13 6 - 23 mg/dL   Creatinine, Ser 7.42 0.40 - 1.20 mg/dL   Total Bilirubin 0.5 0.2 - 1.2 mg/dL   Alkaline  Phosphatase 54 39 - 117 U/L   AST 15 0 - 37 U/L   ALT 17 0 - 35 U/L   Total Protein 7.0 6.0 - 8.3 g/dL   Albumin 4.2 3.5 - 5.2 g/dL   GFR 59.56 >38.75 mL/min    Comment: Calculated using the CKD-EPI Creatinine Equation (2021)   Calcium 8.9 8.4 - 10.5 mg/dL  HCV Ab w Reflex to Quant PCR     Status: None   Collection Time: 06/05/23  9:45 AM  Result Value Ref Range   HCV Ab Non Reactive Non Reactive  HIV Antibody (routine testing w rflx)     Status: None   Collection Time: 06/05/23  9:45 AM  Result Value Ref Range   HIV 1&2 Ab, 4th Generation NON-REACTIVE NON-REACTIVE    Comment: HIV-1 antigen and HIV-1/HIV-2 antibodies were not detected. There is no laboratory evidence of HIV infection. Marland Kitchen PLEASE NOTE: This information has been disclosed to  you from records whose confidentiality may be protected by state law.  If your state requires such protection, then the state law prohibits you from making any further disclosure of the information without the specific written consent of the person to whom it pertains, or as otherwise permitted by law. A general authorization for the release of medical or other information is NOT sufficient for this purpose. . For additional information please refer to http://education.questdiagnostics.com/faq/FAQ106 (This link is being provided for informational/ educational purposes only.) . Marland Kitchen The performance of this assay has not been clinically validated in patients less than 34 years old. .   Lipid Cascade w/Rflx to ApoliB     Status: Abnormal   Collection Time: 06/05/23  9:45 AM  Result Value Ref Range   Cholesterol, Total 221 (H) 100 - 199 mg/dL   HDL 68 >16 mg/dL   LDL/HDL Ratio 2.0 0.0 - 3.2 ratio    Comment:                                     LDL/HDL Ratio                                             Men  Women                               1/2 Avg.Risk  1.0    1.5                                   Avg.Risk  3.6    3.2                                 2X Avg.Risk  6.2    5.0                                3X Avg.Risk  8.0    6.1    Total Non-HDL-Chol (LDL+VLDL) 153 (H) 0 - 129 mg/dL   Triglycerides 109 0 - 149 mg/dL   LDL Chol Calc (NIH) 604 (H) 0 - 99 mg/dL  Urinalysis w microscopic + reflex cultur     Status: Abnormal   Collection Time: 06/05/23  9:45 AM   Specimen: Blood  Result Value Ref Range   Color, Urine YELLOW YELLOW   APPearance TURBID (A) CLEAR   Specific Gravity, Urine 1.016 1.001 - 1.035   pH 8.5 (H) 5.0 - 8.0   Glucose, UA NEGATIVE NEGATIVE   Bilirubin Urine NEGATIVE NEGATIVE   Ketones, ur NEGATIVE NEGATIVE   Hgb urine dipstick 2+ (A) NEGATIVE   Protein, ur 1+ (A) NEGATIVE   Nitrites, Initial NEGATIVE NEGATIVE   Leukocyte Esterase TRACE (A) NEGATIVE   WBC, UA 0-5 0 - 5 /HPF   RBC / HPF 20-40 (A) 0 - 2 /HPF   Squamous Epithelial / HPF 0-5 < OR = 5 /HPF   Bacteria, UA FEW (A) NONE SEEN /HPF   AMORPHOUS SEDIMENT FEW NONE OR FEW /HPF   Hyaline Cast NONE SEEN NONE SEEN /LPF  Yeast FEW (A) NONE SEEN /HPF   Note      Comment: This urine was analyzed for the presence of WBC,  RBC, bacteria, casts, and other formed elements.  Only those elements seen were reported. . .   VITAMIN D 25 Hydroxy (Vit-D Deficiency, Fractures)     Status: Abnormal   Collection Time: 06/05/23  9:45 AM  Result Value Ref Range   VITD 18.13 (L) 30.00 - 100.00 ng/mL  Urine Culture     Status: Abnormal   Collection Time: 06/05/23  9:45 AM  Result Value Ref Range   MICRO NUMBER: [id number]    SPECIMEN QUALITY: Adequate    Sample Source URINE    STATUS: FINAL    ISOLATE 1: Proteus mirabilis (A)     Comment: 50,000-100,000 CFU/mL of Proteus mirabilis This organism may show imipenem resistance by mechanisms other than a carbapenemase.      Susceptibility   Proteus mirabilis - URINE CULTURE, REFLEX    AMOX/CLAVULANIC <=2 Sensitive     AMPICILLIN <=2 Sensitive     AMPICILLIN/SULBACTAM <=2 Sensitive     CEFAZOLIN* <=4 Not Reportable       * For infections other than uncomplicated UTI caused by E. coli, K. pneumoniae or P. mirabilis: Cefazolin is resistant if MIC > or = 8 mcg/mL. (Distinguishing susceptible versus intermediate for isolates with MIC < or = 4 mcg/mL requires additional testing.) For uncomplicated UTI caused by E. coli, K. pneumoniae or P. mirabilis: Cefazolin is susceptible if MIC <32 mcg/mL and predicts susceptible to the oral agents cefaclor, cefdinir, cefpodoxime, cefprozil, cefuroxime, cephalexin and loracarbef.     CEFTAZIDIME <=1 Sensitive     CEFEPIME <=1 Sensitive     CEFTRIAXONE <=1 Sensitive     CIPROFLOXACIN <=0.25 Sensitive     LEVOFLOXACIN <=0.12 Sensitive     GENTAMICIN <=1 Sensitive     IMIPENEM 2 Intermediate     NITROFURANTOIN 128 Resistant     PIP/TAZO <=4 Sensitive     TOBRAMYCIN <=1 Sensitive     TRIMETH/SULFA* <=20 Sensitive      * For infections other than uncomplicated UTI caused by E. coli, K. pneumoniae or P. mirabilis: Cefazolin is resistant if MIC > or = 8 mcg/mL. (Distinguishing susceptible versus intermediate for isolates with MIC < or = 4 mcg/mL requires additional testing.) For uncomplicated UTI caused by E. coli, K. pneumoniae or P. mirabilis: Cefazolin is susceptible if MIC <32 mcg/mL and predicts susceptible to the oral agents cefaclor, cefdinir, cefpodoxime, cefprozil, cefuroxime, cephalexin and loracarbef. Legend: S = Susceptible  I = Intermediate R = Resistant  NS = Not susceptible SDD = Susceptible Dose Dependent * = Not Tested  NR = Not Reported **NN = See Therapy Comments   REFLEXIVE URINE CULTURE     Status: None   Collection Time: 06/05/23  9:45 AM  Result Value Ref Range   REFLEXIVE URINE CULTURE      Comment: CULTURE INDICATED - RESULTS TO FOLLOW  Interpretation:     Status: None   Collection Time: 06/05/23  9:45 AM  Result Value Ref Range   HCV Interp 1: Comment     Comment: Not infected with HCV unless early or acute infection  is suspected (which may be delayed in an immunocompromised individual), or other evidence exists to indicate HCV infection.   Surgical pathology ([facility])     Status: None   Collection Time: 06/19/23 12:00 AM  Result Value Ref Range   SURGICAL PATHOLOGY  SURGICAL PATHOLOGY [facility] [address] [place], [place] [address] Telephone [phone] or [phone] Fax [phone]  REPORT OF SURGICAL PATHOLOGY   Accession #: [id number] Patient Name: JAUNICE, MIRZA Visit # : [id number]  MRN: [id number] Physician: Tiajuana Amass DOB/Age May 01, 1986 (Age: 37) Gender: F Collected Date: 06/19/2023 Received Date: 06/20/2023  FINAL DIAGNOSIS       1. 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      -  AN IMMUNOHISTOCHEMICAL STAIN FOR HELICOBACTER PYLORI ORGANISMS IS NEGATIVE.       DATE SIGNED OUT: 06/21/2023 ELECTRONIC SIGNATURE : Barkley Boards, Pathologist, Electronic Signature  MICROSCOPIC DESCRIPTION  CASE COMMENTS STAINS USED IN DIAGNOSIS: H&E Universal Negative Control-DAB Stains used in diagnosis 1 H. Pylori IHC Some of these immunohistochemical stains  may have been developed and the performance characteristics determined by [facility].  Some may not have been cleared or approved by the U.S. Food and Drug Administration.  The FDA has determined that such clearance or approval is not necessary.  This test is used for clinical purposes.  It should not be regarded as investigational or for research.  This laboratory is certified under the Clinical Laboratory Improvement Amendments of 1988 (CLIA-88) as qualified to perform high complexity clinical laboratory testing.    CLINICAL HISTORY  SPECIMEN(S) OBTAINED 1. Surgical [P], Gastric  SPECIMEN COMMENTS: 1. Gastroesophageal reflux disease,  unspecified whether esophagitis; gastritis without bleeding, unspecified chronicity, unspecified gastritis type SPECIMEN CLINICAL INFORMATION: 1. R/O H. pylori    Gross Description 1. Received in formalin are tan, soft tissue fragments that are submitted in toto. Number: 4, Size: 0.3 cm smallest to 0.5 cm largest,  (1B) ( TT )        Report signed out from the following location(s) [facility] [address], [place] [address] CLIA #: [id number]  [facility] [address] [place], [place] [address] CLIA #: [id number]   Lipase, blood     Status: None   Collection Time: 06/21/23 10:05 AM  Result Value Ref Range   Lipase 25 11 - 51 U/L    Comment: Performed at [facility], [address], [place], [place] [address]  Comprehensive metabolic panel     Status: None   Collection Time: 06/21/23 10:05 AM  Result Value Ref Range   Sodium 136 135 - 145 mmol/L   Potassium 3.5 3.5 - 5.1 mmol/L   Chloride 105 98 - 111 mmol/L   CO2 23 22 - 32 mmol/L   Glucose, Bld 87 70 - 99 mg/dL    Comment: Glucose reference range applies only to samples taken after fasting for at least 8 hours.   BUN 13 6 - 20 mg/dL   Creatinine, Ser 5.57 0.44 - 1.00 mg/dL   Calcium 9.1 8.9 - 32.2 mg/dL   Total Protein 7.7 6.5 - 8.1 g/dL   Albumin 4.4 3.5 - 5.0 g/dL   AST 17 15 - 41 U/L   ALT 19 0 - 44 U/L   Alkaline Phosphatase 60 38 - 126 U/L   Total Bilirubin 0.7 0.3 - 1.2 mg/dL   GFR, Estimated >02 >54 mL/min    Comment: (NOTE) Calculated using the CKD-EPI Creatinine Equation (2021)    Anion gap 8 5 - 15  Comment: Performed at [facility], [address], [place], [place] [address]  CBC     Status: Abnormal   Collection Time: 06/21/23 10:05 AM  Result Value Ref Range   WBC 7.4 4.0 - 10.5 K/uL   RBC 5.63 (H) 3.87 - 5.11 MIL/uL   Hemoglobin 15.3 (H) 12.0 - 15.0 g/dL   HCT 60.4 (H) 54.0 - 98.1 %   MCV 86.3 80.0 -  100.0 fL   MCH 27.2 26.0 - 34.0 pg   MCHC 31.5 30.0 - 36.0 g/dL   RDW 19.1 47.8 - 29.5 %   Platelets 284 150 - 400 K/uL   nRBC 0.0 0.0 - 0.2 %    Comment: Performed at [facility], [address] [place], [place] [address]  Urinalysis, Routine w reflex microscopic -Urine, Clean Catch     Status: Abnormal   Collection Time: 06/21/23 10:05 AM  Result Value Ref Range   Color, Urine STRAW (A) YELLOW   APPearance CLEAR CLEAR   Specific Gravity, Urine 1.005 1.005 - 1.030   pH 8.0 5.0 - 8.0   Glucose, UA NEGATIVE NEGATIVE mg/dL   Hgb urine dipstick NEGATIVE NEGATIVE   Bilirubin Urine NEGATIVE NEGATIVE   Ketones, ur 5 (A) NEGATIVE mg/dL   Protein, ur NEGATIVE NEGATIVE mg/dL   Nitrite NEGATIVE NEGATIVE   Leukocytes,Ua NEGATIVE NEGATIVE    Comment: Performed at [facility], [address] [place], [place] [address]  hCG, serum, qualitative     Status: None   Collection Time: 06/21/23 10:05 AM  Result Value Ref Range   Preg, Serum NEGATIVE NEGATIVE    Comment:        THE SENSITIVITY OF THIS METHODOLOGY IS >10 mIU/mL. Performed at [facility], [address], [place], [place] [address]         Garner Nash, MD, MS

## 2023-06-27 ENCOUNTER — Telehealth: Payer: Self-pay | Admitting: Family Medicine

## 2023-06-27 DIAGNOSIS — K2100 Gastro-esophageal reflux disease with esophagitis, without bleeding: Secondary | ICD-10-CM

## 2023-06-27 MED ORDER — PANTOPRAZOLE SODIUM 40 MG PO TBEC
40.0000 mg | DELAYED_RELEASE_TABLET | Freq: Two times a day (BID) | ORAL | 0 refills | Status: DC
Start: 2023-06-27 — End: 2023-09-22

## 2023-06-27 NOTE — Telephone Encounter (Signed)
Pt said the prescription  you wrote for pantoprazole (PROTONIX) 40 MG tablet [[id number]]  her insurance will not cover it. Pt said she need it adjust to twice a day instead of one a day so that her insurance will cover it. Please give pt a call

## 2023-06-27 NOTE — Telephone Encounter (Signed)
Advised patient and she verbalized understanding

## 2023-07-04 ENCOUNTER — Ambulatory Visit: Payer: 59 | Admitting: Gastroenterology

## 2023-07-04 ENCOUNTER — Encounter: Payer: Self-pay | Admitting: Gastroenterology

## 2023-07-04 VITALS — BP 110/78 | HR 86 | Ht 65.0 in | Wt 152.1 lb

## 2023-07-04 DIAGNOSIS — R1310 Dysphagia, unspecified: Secondary | ICD-10-CM

## 2023-07-04 DIAGNOSIS — R140 Abdominal distension (gaseous): Secondary | ICD-10-CM

## 2023-07-04 DIAGNOSIS — K219 Gastro-esophageal reflux disease without esophagitis: Secondary | ICD-10-CM

## 2023-07-04 DIAGNOSIS — R09A2 Foreign body sensation, throat: Secondary | ICD-10-CM | POA: Diagnosis not present

## 2023-07-04 DIAGNOSIS — R1013 Epigastric pain: Secondary | ICD-10-CM | POA: Diagnosis not present

## 2023-07-04 NOTE — Progress Notes (Signed)
Agree with the assessment and plan as outlined by Boone Master, PA-C.

## 2023-07-04 NOTE — Patient Instructions (Addendum)
You will be due for an office visit in February 2025. You will get a reminder when it is time to schedule.  Thank you for trusting me with your gastrointestinal care!   Boone Master, PA    If your blood pressure at your visit was 140/90 or greater, please contact your primary care physician to follow up on this. ______________________________________________________  If you are age 37 or older, your body mass index should be between 23-30. Your Body mass index is 25.31 kg/m. If this is out of the aforementioned range listed, please consider follow up with your Primary Care Provider.  If you are age 41 or younger, your body mass index should be between 19-25. Your Body mass index is 25.31 kg/m. If this is out of the aformentioned range listed, please consider follow up with your Primary Care Provider.  ________________________________________________________  The [facility] providers would like to encourage you to use [facility] to communicate with providers for non-urgent requests or questions.  Due to long hold times on the telephone, sending your provider a message by [facility] may be a faster and more efficient way to get a response.  Please allow 48 business hours for a response.  Please remember that this is for non-urgent requests.  _______________________________________________________  Due to recent changes in healthcare laws, you may see the results of your imaging and laboratory studies on MyChart before your provider has had a chance to review them.  We understand that in some cases there may be results that are confusing or concerning to you. Not all laboratory results come back in the same time frame and the provider may be waiting for multiple results in order to interpret others.  Please give [place] 48 hours in order for your provider to thoroughly review all the results before contacting the office for clarification of your results.

## 2023-07-04 NOTE — Progress Notes (Signed)
Chief Complaint: Epigastric pain, bloating. Primary GI MD: Dr. Tomasa Rand  HPI: 37 year old female history of idiopathic intracranial hypertension, GERD, presents for follow-up.  Last seen July 2024 by Quentin Mulling, PA-C.  At that time she had a positive H. pylori breath test June 2024 and was treated with clarithromycin and possibly omeprazole.  She continued to have GERD symptoms including epigastric fullness and reflux along with dysphagia with pills.  She was set up for EGD which showed gastric mucosal atrophy with biopsies negative for H. pylori  EGD was done 06/19/2023.  Patient proceeded to ED 06/21/2023 for persistent upper abdominal pain and distention.  CT abdomen pelvis with contrast 06/21/2023 showed no acute findings.  Normal liver.  Gallbladder unremarkable.  Pancreas unremarkable.  Stomach/bowel unremarkable.  Lab work done in the ED Hgb 15.3, no leukocytosis CMP normal Lipase 25  Patient was then seen by PCP 06/26/2023 for persistent GERD characterized by heartburn and abdominal distention.  PCP increase medication to pantoprazole 40 Mg twice daily and famotidine 40 Mg as needed for breakthrough as well as Gas-X.  Patient states since increasing Protonix to twice daily her symptoms have slowly began to improve.  Between taking her PPI and Gas-X her bloating and gas has improved.  Her epigastric discomfort has improved.  And also the globus sensation has improved.  Still struggles to swallow large pills such as her vitamins.  Does fine with smaller pills.  Denies weight loss.   PREVIOUS GI WORKUP   EGD 06/19/2023 for dysphagia and follow-up of H. pylori -Examined portions of the nasopharynx, oropharynx, larynx were normal - Normal esophagus - Gastric mucosal atrophy (biopsied) - Normal examined duodenum - Biopsies negative for H. pylori.  Chronic inactive gastritis.  Past Medical History:  Diagnosis Date   GERD (gastroesophageal reflux disease)    IIH (idiopathic  intracranial hypertension)    Papilledema    Pneumonia     No past surgical history on file.  Current Outpatient Medications  Medication Sig Dispense Refill   acetaZOLAMIDE (DIAMOX) 250 MG tablet Take 1 tablet (250 mg total) by mouth daily. 90 tablet 1   Cholecalciferol (VITAMIN D3) 50 MCG (2000 UT) capsule Take 1 capsule (2,000 Units total) by mouth daily. 90 capsule 3   famotidine (PEPCID) 20 MG tablet Take 2 tablets (40 mg total) by mouth at bedtime as needed for heartburn or indigestion. 90 tablet 0   hydrocortisone (ANUSOL-HC) 2.5 % rectal cream Place 1 Application rectally 2 (two) times daily. 30 g 2   hydrocortisone (ANUSOL-HC) 25 MG suppository Place 1 suppository (25 mg total) rectally 2 (two) times daily. (Patient not taking: Reported on 06/26/2023) 12 suppository 0   Multiple Vitamins-Minerals (MULTIVITAMIN WITH MINERALS) tablet Take 1 tablet by mouth daily.     pantoprazole (PROTONIX) 40 MG tablet Take 1 tablet (40 mg total) by mouth 2 (two) times daily. Take 30-60 min before eating 180 tablet 0   rosuvastatin (CRESTOR) 20 MG tablet Take 1 tablet (20 mg total) by mouth daily. 90 tablet 3   Simethicone 125 MG CAPS Take125 to 250 mg orally as needed after meals and at bedtime; MAX 750 mg/day 28 capsule 0   Vitamin D, Ergocalciferol, (DRISDOL) 1.25 MG (50000 UNIT) CAPS capsule Take 1 capsule (50,000 Units total) by mouth every 7 (seven) days. (Patient not taking: Reported on 06/26/2023) 5 capsule 0   No current facility-administered medications for this visit.    Allergies as of 07/04/2023   (No Known Allergies)  Family History  Problem Relation Age of Onset   Hypertension Mother    Diabetes Mother    Hyperlipidemia Mother    Ovarian cysts Mother    Diabetes Father    Colon cancer Neg Hx    Rectal cancer Neg Hx    Stomach cancer Neg Hx    Esophageal cancer Neg Hx     Social History   Socioeconomic History   Marital status: Single    Spouse name: Not on file    Number of children: Not on file   Years of education: Not on file   Highest education level: Not on file  Occupational History   Occupation: [employer]  Tobacco Use   Smoking status: Never    Passive exposure: Never   Smokeless tobacco: Not on file  Vaping Use   Vaping status: Never Used  Substance and Sexual Activity   Alcohol use: Never   Drug use: Never   Sexual activity: Not on file  Other Topics Concern   Not on file  Social History Narrative   Right handed   Drinks caffeine prn   Lives with parents and two children   Currently working   One floor home   Social Determinants of Health   Financial Resource Strain: Not on file  Food Insecurity: Not on file  Transportation Needs: Not on file  Physical Activity: Not on file  Stress: Not on file  Social Connections: Not on file  Intimate Partner Violence: Not on file    Review of Systems:    Constitutional: No weight loss, fever, chills, weakness or fatigue HEENT: Eyes: No change in vision               Ears, Nose, Throat:  No change in hearing or congestion Skin: No rash or itching Cardiovascular: No chest pain, chest pressure or palpitations   Respiratory: No SOB or cough Gastrointestinal: See HPI and otherwise negative Genitourinary: No dysuria or change in urinary frequency Neurological: No headache, dizziness or syncope Musculoskeletal: No new muscle or joint pain Hematologic: No bleeding or bruising Psychiatric: No history of depression or anxiety    Physical Exam:  Vital signs: LMP 06/05/2023   Constitutional: NAD, Well developed, Well nourished, alert and cooperative Head:  Normocephalic and atraumatic. Eyes:   PEERL, EOMI. No icterus. Conjunctiva pink. Respiratory: Respirations even and unlabored. Lungs clear to auscultation bilaterally.   No wheezes, crackles, or rhonchi.  Cardiovascular:  Regular rate and rhythm. No peripheral edema, cyanosis or pallor.  Gastrointestinal:  Soft, nondistended,  nontender. No rebound or guarding. Normal bowel sounds. No appreciable masses or hepatomegaly. Rectal:  Not performed.  Msk:  Symmetrical without gross deformities. Without edema, no deformity or joint abnormality.  Neurologic:  Alert and  oriented x4;  grossly normal neurologically.  Skin:   Dry and intact without significant lesions or rashes. Psychiatric: Oriented to person, place and time. Demonstrates good judgement and reason without abnormal affect or behaviors.   RELEVANT LABS AND IMAGING: CBC    Component Value Date/Time   WBC 7.4 06/21/2023 1005   RBC 5.63 (H) 06/21/2023 1005   HGB 15.3 (H) 06/21/2023 1005   HCT 48.6 (H) 06/21/2023 1005   PLT 284 06/21/2023 1005   MCV 86.3 06/21/2023 1005   MCH 27.2 06/21/2023 1005   MCHC 31.5 06/21/2023 1005   RDW 13.0 06/21/2023 1005   LYMPHSABS 1.7 06/05/2023 0945   MONOABS 0.4 06/05/2023 0945   EOSABS 0.0 06/05/2023 0945   BASOSABS 0.0  06/05/2023 0945    CMP     Component Value Date/Time   NA 136 06/21/2023 1005   K 3.5 06/21/2023 1005   CL 105 06/21/2023 1005   CO2 23 06/21/2023 1005   GLUCOSE 87 06/21/2023 1005   BUN 13 06/21/2023 1005   CREATININE 0.77 06/21/2023 1005   CALCIUM 9.1 06/21/2023 1005   PROT 7.7 06/21/2023 1005   ALBUMIN 4.4 06/21/2023 1005   AST 17 06/21/2023 1005   ALT 19 06/21/2023 1005   ALKPHOS 60 06/21/2023 1005   BILITOT 0.7 06/21/2023 1005   GFRNONAA >60 06/21/2023 1005   GFRAA  02/17/2011 0140    >60        The eGFR has been calculated using the MDRD equation. This calculation has not been validated in all clinical situations. eGFR's persistently <60 mL/min signify possible Chronic Kidney Disease.     Assessment/Plan:   37 year old female presenting with epigastric pain and abdominal bloating history of H. pylori June 2024 by hydrogen breath test treated with clarithromycin and omeprazole recently underwent EGD 06/19/2023 with biopsies negative for H. pylori and had CT abdomen pelvis  with contrast that was negative.  Dyspepsia characterized by epigastric pain, bloating, globus sensation Improving on PPI twice daily.  EGD overall unremarkable with negative CT scan and negative lipase - Continue 40 Mg PPI twice daily, after about 60 days try to wean down to once daily - If breakthrough symptoms can use Pepcid 40 Mg as needed - If continued breakthrough symptoms despite above can consider addition of Carafate - Provided patient education on GERD and GERD diet as well as a low FODMAP diet - Follow-up in February - Patient will MyChart between now and then if she has any issues.  Pill dysphagia Recommended cutting large pills in half or get vitamins of a smaller caliber  Lara Mulch [facility] 07/04/2023, 1:06 PM  Cc: Garnette Gunner, MD

## 2023-07-11 DIAGNOSIS — Z79899 Other long term (current) drug therapy: Secondary | ICD-10-CM | POA: Diagnosis not present

## 2023-07-21 DIAGNOSIS — R42 Dizziness and giddiness: Secondary | ICD-10-CM | POA: Diagnosis not present

## 2023-07-21 DIAGNOSIS — Z87440 Personal history of urinary (tract) infections: Secondary | ICD-10-CM | POA: Diagnosis not present

## 2023-07-24 ENCOUNTER — Ambulatory Visit (INDEPENDENT_AMBULATORY_CARE_PROVIDER_SITE_OTHER): Payer: 59 | Admitting: Internal Medicine

## 2023-07-24 ENCOUNTER — Encounter: Payer: Self-pay | Admitting: Internal Medicine

## 2023-07-24 VITALS — BP 124/82 | HR 77 | Temp 97.7°F | Ht 65.0 in | Wt 153.4 lb

## 2023-07-24 DIAGNOSIS — R42 Dizziness and giddiness: Secondary | ICD-10-CM | POA: Diagnosis not present

## 2023-07-24 LAB — VITAMIN D 25 HYDROXY (VIT D DEFICIENCY, FRACTURES): VITD: 38.73 ng/mL (ref 30.00–100.00)

## 2023-07-24 LAB — VITAMIN B12: Vitamin B-12: 612 pg/mL (ref 211–911)

## 2023-07-24 NOTE — Progress Notes (Signed)
[facility] [address] [place] [address] Dept: [phone] Dept Fax: [phone]  Acute Care Office Visit  Subjective:   Caroline Charles 1985-09-25 07/24/2023  Chief Complaint  Patient presents with   Dizziness    Started last Tuesday     HPI: Discussed the use of AI scribe software for clinical note transcription with the patient, who gave verbal consent to proceed.  History of Present Illness   The patient, with a history of idiopathic intracranial hypertension (IIH), acid reflux, vitamin D deficiency, and B12 deficiency, presents with a chief complaint of dizziness that has been ongoing for the past week. The dizziness is described as a feeling of movement when sitting still and a spinning sensation when turning their head left and right. The patient reports that the dizziness is not associated with changes in position, such as standing up quickly after lying down. She does report increase caffeine consumption and decreased water intake lately. The patient had previously visited an urgent care center on 07/21/23 for CC where blood work was done ( CBC, CMP, upreg, UA, which did not show acute cause)and meclizine was prescribed. She reports she has only taken 1 dose of the meclizine because it made her drowsy, however, she did notice some improvement of dizziness after taking medication. No vision changes, recent headache, numbness/tingling in extremities, N/V.         The following portions of the patient's history were reviewed and updated as appropriate: past medical history, past surgical history, family history, social history, allergies, medications, and problem list.   Patient Active Problem List   Diagnosis Date Noted   Erythrocytosis 06/05/2023   Polydipsia 06/05/2023   Upper airway cough syndrome 05/26/2023   H. 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 No pertinent surgical history. Family History  Problem Relation Age of Onset   Hypertension Mother    Diabetes Mother    Hyperlipidemia Mother    Ovarian cysts Mother    Diabetes Father    Colon cancer Neg Hx    Rectal cancer Neg Hx    Stomach cancer Neg Hx    Esophageal cancer Neg Hx     Current Outpatient Medications:    acetaZOLAMIDE (DIAMOX) 250 MG tablet, Take 1 tablet (250 mg total) by mouth daily., Disp: 90 tablet, Rfl: 1   Cholecalciferol (VITAMIN D3) 50 MCG (2000 UT) capsule, Take 1 capsule (2,000 Units total) by mouth daily., Disp: 90 capsule, Rfl: 3   famotidine (PEPCID) 20 MG tablet, Take 2 tablets (40 mg total) by mouth at bedtime as needed for heartburn or indigestion., Disp: 90 tablet, Rfl: 0   meclizine (ANTIVERT) 12.5 MG tablet, Take by mouth., Disp: , Rfl:    Multiple Vitamins-Minerals (MULTIVITAMIN WITH MINERALS) tablet, Take 1 tablet by mouth daily., Disp: , Rfl:    pantoprazole (PROTONIX) 40 MG tablet, Take 1 tablet (40 mg total) by mouth 2 (two) times daily.  Take 30-60 min before eating, Disp: 180 tablet, Rfl: 0   rosuvastatin (CRESTOR) 20 MG tablet, Take 1 tablet (20 mg total) by mouth daily., Disp: 90 tablet, Rfl: 3   hydrocortisone (ANUSOL-HC) 2.5 % rectal cream, Place 1 Application rectally 2 (two) times daily. (Patient not taking: Reported on 07/24/2023), Disp: 30 g, Rfl: 2   hydrocortisone (ANUSOL-HC) 25 MG suppository, Place 1 suppository (25 mg  total) rectally 2 (two) times daily. (Patient not taking: Reported on 06/26/2023), Disp: 12 suppository, Rfl: 0   Simethicone 125 MG CAPS, [id number] to 250 mg orally as needed after meals and at bedtime; MAX 750 mg/day (Patient not taking: Reported on 07/24/2023), Disp: 28 capsule, Rfl: 0   Vitamin D, Ergocalciferol, (DRISDOL) 1.25 MG (50000 UNIT) CAPS capsule, Take 1 capsule (50,000 Units total) by mouth every 7 (seven) days. (Patient not taking: Reported on 07/24/2023), Disp: 5 capsule, Rfl: 0 No Known Allergies   ROS: A complete ROS was performed with pertinent positives/negatives noted in the HPI. The remainder of the ROS are negative.    Objective:   Today's Vitals   07/24/23 1347  BP: 124/82  Pulse: 77  Temp: 97.7 F (36.5 C)  TempSrc: Temporal  SpO2: 98%  Weight: 153 lb 6.4 oz (69.6 kg)  Height: 5\' 5"  (1.651 m)    GENERAL: Well-appearing, in NAD. Well nourished.  SKIN: Pink, warm and dry. No rash, lesion, ulceration, or ecchymoses.  NECK: Trachea midline. Full ROM w/o pain or tenderness. No lymphadenopathy.  RESPIRATORY: Chest wall symmetrical. Respirations even and non-labored. Breath sounds clear to auscultation bilaterally.  CARDIAC: S1, S2 present, regular rate and rhythm. Peripheral pulses 2+ bilaterally.  MSK: Muscle tone and strength appropriate for age.  EXTREMITIES: Without clubbing, cyanosis, or edema.  NEUROLOGIC: No motor or sensory deficits. Steady, even gait. Negative dix-halpike manuever  PSYCH/MENTAL STATUS: Alert, oriented x 3. Cooperative, appropriate mood and affect.    No results found for any visits on 07/24/23.    Assessment & Plan:  Assessment and Plan    Dizziness Persistent dizziness for the past week, described as a spinning sensation, exacerbated by head movements. Patient has been taking Meclizine with some improvement but limited use due to drowsiness. No associated nausea or vomiting during episodes of dizziness. -Continue Meclizine at night as  needed for dizziness. -Perform Epley Maneuver at home to help with dizziness. -Reduce caffeine intake and increase hydration.  Vitamin D and B12 Deficiency History of deficiencies, currently taking Vitamin D daily. Does not like taking B12 due to large pill size.  -Check Vitamin D and B12 levels. -If B12 is low, consider prescribing a suitable formulation for the patient.  General Health Maintenance -Encourage consistent hydration and balanced diet. -Follow-up as needed for persistent symptoms.      No orders of the defined types were placed in this encounter.  Orders Placed This Encounter  Procedures   B12   VITAMIN D 25 Hydroxy (Vit-D Deficiency, Fractures)   Lab Orders         B12         VITAMIN D 25 Hydroxy (Vit-D Deficiency, Fractures)     No images are attached to the encounter or orders placed in the encounter.  Return if symptoms worsen or fail to improve.   Salvatore Decent, FNP

## 2023-07-24 NOTE — Patient Instructions (Addendum)
Continue Meclizine at night time for dizziness  Drink more water daily , decrease caffeine  Do exercises (on discharge summary) for dizziness  I will notify you of your blood work results

## 2023-08-15 ENCOUNTER — Encounter: Payer: Self-pay | Admitting: Family Medicine

## 2023-08-15 ENCOUNTER — Telehealth: Payer: Self-pay | Admitting: Family Medicine

## 2023-08-15 ENCOUNTER — Ambulatory Visit (INDEPENDENT_AMBULATORY_CARE_PROVIDER_SITE_OTHER): Payer: 59 | Admitting: Family Medicine

## 2023-08-15 VITALS — BP 140/80 | HR 79 | Temp 97.5°F | Wt 153.2 lb

## 2023-08-15 DIAGNOSIS — K219 Gastro-esophageal reflux disease without esophagitis: Secondary | ICD-10-CM

## 2023-08-15 DIAGNOSIS — R42 Dizziness and giddiness: Secondary | ICD-10-CM

## 2023-08-15 DIAGNOSIS — G932 Benign intracranial hypertension: Secondary | ICD-10-CM | POA: Diagnosis not present

## 2023-08-15 DIAGNOSIS — I10 Essential (primary) hypertension: Secondary | ICD-10-CM

## 2023-08-15 MED ORDER — AMLODIPINE BESYLATE 2.5 MG PO TABS
2.5000 mg | ORAL_TABLET | Freq: Every day | ORAL | 0 refills | Status: DC
Start: 2023-08-15 — End: 2023-09-09

## 2023-08-15 NOTE — Telephone Encounter (Signed)
Caller Name: Akayla Braniff Ph #: [phone] Chief Complaint: high BP readings was up to 186/122 this am it is 140/102 feeling weak   This call was transferred to Nurse Triage/Access Nurse. This is for documentation purposes. No follow up required at this time.

## 2023-08-15 NOTE — Progress Notes (Signed)
Assessment/Plan:   Problem List Items Addressed This Visit       Cardiovascular and Mediastinum   Primary hypertension - Primary    New onset elevated blood pressure readings at home, highest being approximately 186/122?mmHg measured with a wrist monitor under improper technique. In-office readings are mildly elevated at 140/80?mmHg. Factors potentially contributing include recent stress, increased caffeine intake, and improper home measurement techniques.  Differential Diagnosis:  Essential hypertension Stress-induced blood pressure elevation White-coat hypertension   Plan:  Initiate amlodipine 2.5?mg orally once daily to manage elevated blood pressure. Educated on proper blood pressure measurement techniques using an upper arm cuff. Advise acquisition of an upper arm blood pressure monitor. Instruct to monitor and record blood pressure daily, preferably in the morning before activities, for one month. Discuss potential side effects of amlodipine, including peripheral edema; note that current diuretic therapy with Diamox may mitigate this risk. Continue current medications as prescribed. Follow-Up Recommendations:  Schedule follow-up appointment in one month to reassess blood pressure and evaluate response to therapy.      Relevant Medications   amLODipine (NORVASC) 2.5 MG tablet     Digestive   Gastroesophageal reflux disease     Nervous and Auditory   IIH (idiopathic intracranial hypertension)     Other   Vertigo    Medications Discontinued During This Encounter  Medication Reason   hydrocortisone (ANUSOL-HC) 2.5 % rectal cream     Return in about 1 month (around 09/14/2023) for BP.    Subjective:   Encounter date: 08/15/2023  Caroline Charles is a 37 y.o. female who has COVID-19 long hauler manifesting chronic dyspnea; IIH (idiopathic intracranial hypertension); Vitamin D deficiency; Hyperlipidemia; Birth control counseling; Fibroid; Gastroesophageal reflux  disease; Dyspnea; B12 deficiency; H. pylori infection; Chronic left shoulder pain; Vaginal discharge; Upper airway cough syndrome; Erythrocytosis; Polydipsia; Primary hypertension; and Vertigo on their problem list..   She  has a past medical history of GERD (gastroesophageal reflux disease), IIH (idiopathic intracranial hypertension), Papilledema, and Pneumonia..   Chief Complaint: Elevated blood pressure readings  History of Present Illness:  Patient presents with concerns about elevated blood pressure readings at home. Last night, after taking their children to the dentist and then out for a buffet meal, they consumed a heavy, greasy meal and increased caffeine intake (lattes). They also experienced stress due to losing money.  Around 9:30-10:00 PM, Patient measured their blood pressure using a wrist monitor while sitting with legs crossed and obtained a reading of approximately 186/122?mmHg. After laying down and rechecking at 9:56 PM, the blood pressure reduced to 153/108?mmHg. This morning, readings were 136/88?mmHg and later 140/102?mmHg at 8:00 AM.  Patient reports feeling better today with resolution of tiredness. Denies headache, blurred vision, chest pain, shortness of breath, heartburn, or dizziness. Notes increased caffeine intake recently and episodes of stress.  Review of Systems:  Constitutional: Reports fatigue last night, resolved today. Cardiovascular: Denies chest pain. Respiratory: Denies cough, shortness of breath. Gastrointestinal: Reports recent heartburn after heavy meal, now resolved. Neurological: Denies headache, dizziness (resolved previously), or blurred vision. Genitourinary: Denies urinary symptoms; notes increased urination due to high water intake and possibly due to Diamox. Musculoskeletal: Denies leg swelling, cramping. Psychiatric: Reports stress due to losing money. Remainder of ROS is negative.    No past surgical history on file.  Outpatient  Medications Prior to Visit  Medication Sig Dispense Refill   acetaZOLAMIDE (DIAMOX) 250 MG tablet Take 1 tablet (250 mg total) by mouth daily. 90 tablet 1  Cholecalciferol (VITAMIN D3) 50 MCG (2000 UT) capsule Take 1 capsule (2,000 Units total) by mouth daily. 90 capsule 3   famotidine (PEPCID) 20 MG tablet Take 2 tablets (40 mg total) by mouth at bedtime as needed for heartburn or indigestion. 90 tablet 0   meclizine (ANTIVERT) 12.5 MG tablet Take by mouth.     Multiple Vitamins-Minerals (MULTIVITAMIN WITH MINERALS) tablet Take 1 tablet by mouth daily.     pantoprazole (PROTONIX) 40 MG tablet Take 1 tablet (40 mg total) by mouth 2 (two) times daily. Take 30-60 min before eating 180 tablet 0   rosuvastatin (CRESTOR) 20 MG tablet Take 1 tablet (20 mg total) by mouth daily. 90 tablet 3   Simethicone 125 MG CAPS Take125 to 250 mg orally as needed after meals and at bedtime; MAX 750 mg/day 28 capsule 0   Vitamin D, Ergocalciferol, (DRISDOL) 1.25 MG (50000 UNIT) CAPS capsule Take 1 capsule (50,000 Units total) by mouth every 7 (seven) days. 5 capsule 0   hydrocortisone (ANUSOL-HC) 25 MG suppository Place 1 suppository (25 mg total) rectally 2 (two) times daily. (Patient not taking: Reported on 06/26/2023) 12 suppository 0   hydrocortisone (ANUSOL-HC) 2.5 % rectal cream Place 1 Application rectally 2 (two) times daily. (Patient not taking: Reported on 07/24/2023) 30 g 2   No facility-administered medications prior to visit.    Family History  Problem Relation Age of Onset   Hypertension Mother    Diabetes Mother    Hyperlipidemia Mother    Ovarian cysts Mother    Diabetes Father    Colon cancer Neg Hx    Rectal cancer Neg Hx    Stomach cancer Neg Hx    Esophageal cancer Neg Hx     Social History   Socioeconomic History   Marital status: Single    Spouse name: Not on file   Number of children: Not on file   Years of education: Not on file   Highest education level: Not on file   Occupational History   Occupation: NAIL TE[employer]  Tobacco Use   Smoking status: Never    Passive exposure: Never   Smokeless tobacco: Never  Vaping Use   Vaping status: Never Used  Substance and Sexual Activity   Alcohol use: Not Currently   Drug use: Never   Sexual activity: Not on file  Other Topics Concern   Not on file  Social History Narrative   Right handed   Drinks caffeine prn   Lives with parents and two children   Currently working   One floor home   Social Determinants of Health   Financial Resource Strain: Not on file  Food Insecurity: Not on file  Transportation Needs: Not on file  Physical Activity: Not on file  Stress: Not on file  Social Connections: Not on file  Intimate Partner Violence: Not on file                                                                                                  Objective:  Physical Exam: BP (!) 140/80   Pulse 79  Temp (!) 97.5 F (36.4 C) (Temporal)   Wt 153 lb 3.2 oz (69.5 kg)   LMP 07/30/2023   SpO2 99%   BMI 25.49 kg/m     Physical Exam Constitutional:      General: She is not in acute distress.    Appearance: Normal appearance. She is not ill-appearing or toxic-appearing.  HENT:     Head: Normocephalic and atraumatic.     Nose: Nose normal. No congestion.  Eyes:     General: Vision grossly intact. No scleral icterus.    Extraocular Movements: Extraocular movements intact.     Conjunctiva/sclera: Conjunctivae normal.     Pupils: Pupils are equal, round, and reactive to light.  Cardiovascular:     Rate and Rhythm: Normal rate and regular rhythm.     Pulses: Normal pulses.     Heart sounds: Normal heart sounds.  Pulmonary:     Effort: Pulmonary effort is normal. No respiratory distress.     Breath sounds: Normal breath sounds.  Abdominal:     General: Abdomen is flat. Bowel sounds are normal.     Palpations: Abdomen is soft.  Musculoskeletal:        General: Normal range of motion.   Lymphadenopathy:     Cervical: No cervical adenopathy.  Skin:    General: Skin is warm and dry.     Findings: No rash.  Neurological:     General: No focal deficit present.     Mental Status: She is alert and oriented to person, place, and time. Mental status is at baseline.     Cranial Nerves: Cranial nerves 2-12 are intact.     Gait: Gait is intact.  Psychiatric:        Mood and Affect: Mood normal.        Behavior: Behavior normal.        Thought Content: Thought content normal.        Judgment: Judgment normal.     CT ABDOMEN PELVIS W CONTRAST  Result Date: 06/21/2023 CLINICAL DATA:  Acute upper abdominal pain and distention. Recent endoscopy. EXAM: CT ABDOMEN AND PELVIS WITH CONTRAST TECHNIQUE: Multidetector CT imaging of the abdomen and pelvis was performed using the standard protocol following bolus administration of intravenous contrast. RADIATION DOSE REDUCTION: This exam was performed according to the departmental dose-optimization program which includes automated exposure control, adjustment of the mA and/or kV according to patient size and/or use of iterative reconstruction technique. CONTRAST:  75mL OMNIPAQUE IOHEXOL 300 MG/ML  SOLN COMPARISON:  11/16/2022 FINDINGS: Lower Chest: No acute findings. Hepatobiliary: No suspicious hepatic masses identified. Gallbladder is unremarkable. No evidence of biliary ductal dilatation. Pancreas:  No mass or inflammatory changes. Spleen: Within normal limits in size and appearance. Adrenals/Urinary Tract: No suspicious masses identified. No evidence of ureteral calculi or hydronephrosis. Stomach/Bowel: No evidence of obstruction, inflammatory process or abnormal fluid collections. Normal appendix visualized. Vascular/Lymphatic: No pathologically enlarged lymph nodes. No acute vascular findings. Reproductive:  No mass or other significant abnormality. Other:  None. Musculoskeletal:  No suspicious bone lesions identified. IMPRESSION: No acute  findings or other significant abnormality. Electronically Signed   By: Danae Orleans M.D.   On: 06/21/2023 16:05   DG Chest 2 View  Result Date: 06/21/2023 CLINICAL DATA:  Chest pain EXAM: CHEST - 2 VIEW COMPARISON:  11/16/2022 FINDINGS: The heart size and mediastinal contours are within normal limits. Both lungs are clear. The visualized skeletal structures are unremarkable. IMPRESSION: No active cardiopulmonary disease. Electronically Signed  By: Osvaldo Shipper M.D.   On: 06/21/2023 14:26    Recent Results (from the past 2160 hour(s))  Pulmonary function test     Status: None   Collection Time: 05/25/23  3:37 PM  Result Value Ref Range   FVC-Pre 3.15 L   FVC-%Pred-Pre 81 %   FVC-Post 3.00 L   FVC-%Pred-Post 77 %   FVC-%Change-Post -4 %   FEV1-Pre 2.62 L   FEV1-%Pred-Pre 82 %   FEV1-Post 1.98 L   FEV1-%Pred-Post 62 %   FEV1-%Change-Post -24 %   FEV6-Pre 3.15 L   FEV6-%Pred-Pre 83 %   FEV6-Post 2.96 L   FEV6-%Pred-Post 78 %   FEV6-%Change-Post -5 %   Pre FEV1/FVC ratio 83 %   FEV1FVC-%Pred-Pre 100 %   Post FEV1/FVC ratio 66 %   FEV1FVC-%Change-Post -20 %   Pre FEV6/FVC Ratio 100 %   FEV6FVC-%Pred-Pre 101 %   Post FEV6/FVC ratio 99 %   FEV6FVC-%Pred-Post 100 %   FEV6FVC-%Change-Post -1 %   FEF 25-75 Pre 3.03 L/sec   FEF2575-%Pred-Pre 92 %   FEF 25-75 Post 1.09 L/sec   FEF2575-%Pred-Post 33 %   FEF2575-%Change-Post -63 %   RV 1.36 L   RV % pred 86 %   TLC 4.52 L   TLC % pred 86 %   DLCO unc 27.87 ml/min/mmHg   DLCO unc % pred 122 %   DLCO cor 27.87 ml/min/mmHg   DLCO cor % pred 122 %   DL/VA 7.56 ml/min/mmHg/L   DL/VA % pred 433 %  Familial Hypercholesterolemia     Status: None   Collection Time: 06/05/23  9:45 AM  Result Value Ref Range   Genes Comment     Comment: 4 genes     Ethnicity Comment     Comment: Not Provided     Specimen Type Comment     Comment: Whole Blood     Indication Comment     Comment: Suspected diagnosis     Result: Comment      Comment: NEGATIVE     Interpretation Comment     Comment: No pathogenic variants or variants of uncertain significance were detected for the genes analyzed. This result does not support or rule out a diagnosis of, or a predisposition to, disorders related to the genes tested.     Recommendations Comment     Comment: If the above result is positive, genetic counseling is recommended to discuss the potential clinical and/or reproductive implications, as well as recommendations for testing family members.     Comments Comment     Comment: This interpretation is based on the clinical information provided and the current understanding of the molecular genetics of the disorder(s) tested.     Methods/Limitations Comment     Comment: Next-generation Sequencing (NGS): Genomic regions of interest are selected using the [employer](R) hybridization capture method and sequenced via the Illumina(R) NGS platform. Sequencing reads are aligned to the human genome reference GRCh37/hg19 build. Regions of interest include coding exons, intron/exon junctions (typically +/- 20 nucleotides) and additional genomic regions with known significant pathogenic variants. Estimated analytical sensitivity is >99% for single nucleotide variants and insertions/deletions <45 base pairs. [employer] and in-house algorithms identify copy number variants (CNVs) by comparing normalized read depth for each target in the region of interest with a set of clinical control samples or to the median read depth across the samples within the same NGS run. Single exon deletions or duplications can be detected in the  DMD gene with estimated overall analytical sensitivity of 96.3%. For all other genes, the assay is designed t o detect CNVs involving two or more consecutive coding exons with overall analytical sensitivity of 97.5%. Large single-exon deletions or duplications may be detected.  Precise breakpoints are not reported. Confirmatory testing by orthogonal technologies may include Sanger sequencing, MLPA, gap PCR, or low coverage whole genome sequencing analysis.  If the following genes are included in this test, these analysis restrictions are applied: APOB includes only 556bp of exon 26; MED12 includes only the c.3020A>G (p.N1007S) variant; TBX1 excludes [id number] in exon 3; TTN excludes exons 154 and 155. Regions not included in CNV analysis: MYH6 exons 26 and 27, MYH7 exons 26 and 27, NF1 gene. Regions that may have lower analytical sensitivity due to intrinsic sequence properties: TGFBR exon 1.  Reported variants: Pathogenic and likely pathogenic variants are reported for all tests. Benign and likely benign variants are typically not reported. Variants of uncert ain significance are reported when included in the test specification. Variants are specified using the numbering and nomenclature recommended by the [employer] Variation Society (HGVS, ViralSquad.com.cy). Variant classification and confirmation are consistent with ACMG standards and guidelines Peggye Pitt, WJXB:[id number] Garibaldi, OZHY:[id number]). Detailed variant classification information and reevaluation are available upon request.  Limitations: Technologies used do not detect germline mosaicism and do not rule out the presence of large chromosomal aberrations including rearrangements and gene fusions, or variants in regions or genes not included in this test, or possible inter/intragenic interactions between variants, or repeat expansions. Variant classification and/or interpretation may change over time if more information becomes available. False positive or false negative results may occur for reasons that include: rare genetic variants, sex chromosome abnormali ties, pseudogene interference, blood transfusions, bone marrow transplantation, somatic or tissue-specific  mosaicism, mislabeled samples, or erroneous representation of family relationships.  This test was developed and its performance characteristics determined by [employer]. It has not been cleared or approved by the Food and Drug Administration.  [facility] [employer] is a subsidiary of [employer], using the brand [employer]. Inheritest(R) and [employer]) are registered service marks of [employer].     References Comment     Comment: Hershberger RE, Aldona Bar CY et al. Genetic evaluation of cardiomyopathy: a Veterinary surgeon of the [employer] of [employer] [facility] ([facility]). Genet Med 20, 899 (2018). PMID: 62952841  Musunuru K, Hershberger RE, Day SM et al. Genetic testing for inherited cardiovascular diseases: a scientific statement from the American Heart Association. Circ Genom Precis Med 13 (2020). PMID: 32440102     Genes Analyzed Comment     Comment: APOB,LDLR,LDLRAP1,PCSK9     Director Review/Release Comment     Comment: Component Type      Performed At         Laboratory                                          Director  Technical           [facility]     Marilynn Latino, PhD, component,          Laboratories, [place],   [facility] processing          [phone] Computer  [address]                     [place], [place],                     [address]  Technical           [facility]     Marilynn Latino, PhD, component,          Laboratories, [place],   [facility] analysis            [address]                     [place], [place],                     [address]  Professional        [facility]     Shaune Pascal, component           Laboratories, [place],   PhD, [facility]                     [address]                     [place], [place],                     [address]  Electronically released by Shaune Pascal, PhD,  [facility]    TSH     Status: None   Collection Time: 06/05/23  9:45 AM  Result Value Ref Range   TSH 2.64 0.35 - 5.50 uIU/mL  Hemoglobin A1c     Status: None   Collection Time: 06/05/23  9:45 AM  Result Value Ref Range   Hgb A1c MFr Bld 5.3 4.6 - 6.5 %    Comment: Glycemic Control Guidelines for People with Diabetes:Non Diabetic:  <6%Goal of Therapy: <7%Additional Action Suggested:  >8%   CBC with Differential/Platelet     Status: Abnormal   Collection Time: 06/05/23  9:45 AM  Result Value Ref Range   WBC 6.6 4.0 - 10.5 K/uL   RBC 5.63 (H) 3.87 - 5.11 Mil/uL   Hemoglobin 15.1 (H) 12.0 - 15.0 g/dL   HCT 08.6 (H) 57.8 - 46.9 %   MCV 85.6 78.0 - 100.0 fl   MCHC 31.4 30.0 - 36.0 g/dL   RDW 62.9 52.8 - 41.3 %   Platelets 294.0 150.0 - 400.0 K/uL   Neutrophils Relative % 66.6 43.0 - 77.0 %   Lymphocytes Relative 26.1 12.0 - 46.0 %   Monocytes Relative 6.2 3.0 - 12.0 %   Eosinophils Relative 0.5 0.0 - 5.0 %   Basophils Relative 0.6 0.0 - 3.0 %   Neutro Abs 4.4 1.4 - 7.7 K/uL   Lymphs Abs 1.7 0.7 - 4.0 K/uL   Monocytes Absolute 0.4 0.1 - 1.0 K/uL   Eosinophils Absolute 0.0 0.0 - 0.7 K/uL   Basophils Absolute 0.0 0.0 - 0.1 K/uL  Comprehensive metabolic panel     Status: None   Collection Time: 06/05/23  9:45 AM  Result Value Ref Range   Sodium 138 135 - 145 mEq/L   Potassium 3.9 3.5 - 5.1 mEq/L   Chloride 106 96 - 112 mEq/L   CO2 22 19 - 32 mEq/L   Glucose, Bld 78 70 - 99 mg/dL  BUN 13 6 - 23 mg/dL   Creatinine, Ser 1.61 0.40 - 1.20 mg/dL   Total Bilirubin 0.5 0.2 - 1.2 mg/dL   Alkaline Phosphatase 54 39 - 117 U/L   AST 15 0 - 37 U/L   ALT 17 0 - 35 U/L   Total Protein 7.0 6.0 - 8.3 g/dL   Albumin 4.2 3.5 - 5.2 g/dL   GFR 09.60 >45.40 mL/min    Comment: Calculated using the CKD-EPI Creatinine Equation (2021)   Calcium 8.9 8.4 - 10.5 mg/dL  HCV Ab w Reflex to Quant PCR     Status: None   Collection Time: 06/05/23  9:45 AM  Result Value Ref Range   HCV Ab Non Reactive Non  Reactive  HIV Antibody (routine testing w rflx)     Status: None   Collection Time: 06/05/23  9:45 AM  Result Value Ref Range   HIV 1&2 Ab, 4th Generation NON-REACTIVE NON-REACTIVE    Comment: HIV-1 antigen and HIV-1/HIV-2 antibodies were not detected. There is no laboratory evidence of HIV infection. Marland Kitchen PLEASE NOTE: This information has been disclosed to you from records whose confidentiality may be protected by state law.  If your state requires such protection, then the state law prohibits you from making any further disclosure of the information without the specific written consent of the person to whom it pertains, or as otherwise permitted by law. A general authorization for the release of medical or other information is NOT sufficient for this purpose. . For additional information please refer to http://education.questdiagnostics.com/faq/FAQ106 (This link is being provided for informational/ educational purposes only.) . Marland Kitchen The performance of this assay has not been clinically validated in patients less than 62 years old. .   Lipid Cascade w/Rflx to ApoliB     Status: Abnormal   Collection Time: 06/05/23  9:45 AM  Result Value Ref Range   Cholesterol, Total 221 (H) 100 - 199 mg/dL   HDL 68 >98 mg/dL   LDL/HDL Ratio 2.0 0.0 - 3.2 ratio    Comment:                                     LDL/HDL Ratio                                             Men  Women                               1/2 Avg.Risk  1.0    1.5                                   Avg.Risk  3.6    3.2                                2X Avg.Risk  6.2    5.0                                3X Avg.Risk  8.0    6.1    Total Non-HDL-Chol (LDL+VLDL) 153 (  H) 0 - 129 mg/dL   Triglycerides 409 0 - 149 mg/dL   LDL Chol Calc (NIH) 811 (H) 0 - 99 mg/dL  Urinalysis w microscopic + reflex cultur     Status: Abnormal   Collection Time: 06/05/23  9:45 AM   Specimen: Blood  Result Value Ref Range   Color, Urine YELLOW YELLOW    APPearance TURBID (A) CLEAR   Specific Gravity, Urine 1.016 1.001 - 1.035   pH 8.5 (H) 5.0 - 8.0   Glucose, UA NEGATIVE NEGATIVE   Bilirubin Urine NEGATIVE NEGATIVE   Ketones, ur NEGATIVE NEGATIVE   Hgb urine dipstick 2+ (A) NEGATIVE   Protein, ur 1+ (A) NEGATIVE   Nitrites, Initial NEGATIVE NEGATIVE   Leukocyte Esterase TRACE (A) NEGATIVE   WBC, UA 0-5 0 - 5 /HPF   RBC / HPF 20-40 (A) 0 - 2 /HPF   Squamous Epithelial / HPF 0-5 < OR = 5 /HPF   Bacteria, UA FEW (A) NONE SEEN /HPF   AMORPHOUS SEDIMENT FEW NONE OR FEW /HPF   Hyaline Cast NONE SEEN NONE SEEN /LPF   Yeast FEW (A) NONE SEEN /HPF   Note      Comment: This urine was analyzed for the presence of WBC,  RBC, bacteria, casts, and other formed elements.  Only those elements seen were reported. . .   VITAMIN D 25 Hydroxy (Vit-D Deficiency, Fractures)     Status: Abnormal   Collection Time: 06/05/23  9:45 AM  Result Value Ref Range   VITD 18.13 (L) 30.00 - 100.00 ng/mL  Urine Culture     Status: Abnormal   Collection Time: 06/05/23  9:45 AM  Result Value Ref Range   MICRO NUMBER: [id number]    SPECIMEN QUALITY: Adequate    Sample Source URINE    STATUS: FINAL    ISOLATE 1: Proteus mirabilis (A)     Comment: 50,000-100,000 CFU/mL of Proteus mirabilis This organism may show imipenem resistance by mechanisms other than a carbapenemase.      Susceptibility   Proteus mirabilis - URINE CULTURE, REFLEX    AMOX/CLAVULANIC <=2 Sensitive     AMPICILLIN <=2 Sensitive     AMPICILLIN/SULBACTAM <=2 Sensitive     CEFAZOLIN* <=4 Not Reportable      * For infections other than uncomplicated UTI caused by E. coli, K. pneumoniae or P. mirabilis: Cefazolin is resistant if MIC > or = 8 mcg/mL. (Distinguishing susceptible versus intermediate for isolates with MIC < or = 4 mcg/mL requires additional testing.) For uncomplicated UTI caused by E. coli, K. pneumoniae or P. mirabilis: Cefazolin is susceptible if MIC <32 mcg/mL and  predicts susceptible to the oral agents cefaclor, cefdinir, cefpodoxime, cefprozil, cefuroxime, cephalexin and loracarbef.     CEFTAZIDIME <=1 Sensitive     CEFEPIME <=1 Sensitive     CEFTRIAXONE <=1 Sensitive     CIPROFLOXACIN <=0.25 Sensitive     LEVOFLOXACIN <=0.12 Sensitive     GENTAMICIN <=1 Sensitive     IMIPENEM 2 Intermediate     NITROFURANTOIN 128 Resistant     PIP/TAZO <=4 Sensitive     TOBRAMYCIN <=1 Sensitive     TRIMETH/SULFA* <=20 Sensitive      * For infections other than uncomplicated UTI caused by E. coli, K. pneumoniae or P. mirabilis: Cefazolin is resistant if MIC > or = 8 mcg/mL. (Distinguishing susceptible versus intermediate for isolates with MIC < or = 4 mcg/mL requires additional testing.) For uncomplicated UTI caused by E. coli, K.  pneumoniae or P. mirabilis: Cefazolin is susceptible if MIC <32 mcg/mL and predicts susceptible to the oral agents cefaclor, cefdinir, cefpodoxime, cefprozil, cefuroxime, cephalexin and loracarbef. Legend: S = Susceptible  I = Intermediate R = Resistant  NS = Not susceptible SDD = Susceptible Dose Dependent * = Not Tested  NR = Not Reported **NN = See Therapy Comments   REFLEXIVE URINE CULTURE     Status: None   Collection Time: 06/05/23  9:45 AM  Result Value Ref Range   REFLEXIVE URINE CULTURE      Comment: CULTURE INDICATED - RESULTS TO FOLLOW  Interpretation:     Status: None   Collection Time: 06/05/23  9:45 AM  Result Value Ref Range   HCV Interp 1: Comment     Comment: Not infected with HCV unless early or acute infection is suspected (which may be delayed in an immunocompromised individual), or other evidence exists to indicate HCV infection.   Surgical pathology ([facility])     Status: None   Collection Time: 06/19/23 12:00 AM  Result Value Ref Range   SURGICAL PATHOLOGY      SURGICAL PATHOLOGY [facility] [address], [address] [place], [place] [address] Telephone [phone]  or [phone] Fax [phone]  REPORT OF SURGICAL PATHOLOGY   Accession #: [id number] Patient Name: Caroline Charles, Caroline Charles Visit # : [id number]  MRN: [id number] Physician: Tiajuana Amass DOB/Age [birth year] (Age: 35) Gender: F Collected Date: 06/19/2023 Received Date: 06/20/2023  FINAL DIAGNOSIS       1. 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      -  AN IMMUNOHISTOCHEMICAL STAIN FOR HELICOBACTER PYLORI ORGANISMS IS NEGATIVE.       DATE SIGNED OUT: 06/21/2023 ELECTRONIC SIGNATURE : Barkley Boards, Pathologist, Electronic Signature  MICROSCOPIC DESCRIPTION  CASE COMMENTS STAINS USED IN DIAGNOSIS: H&E Universal Negative Control-DAB Stains used in diagnosis 1 H. Pylori IHC Some of these immunohistochemical stains  may have been developed and the performance characteristics determined by [facility].  Some may not have been cleared or approved by the U.S. Food and Drug Administration.  The FDA has determined that such clearance or approval is not necessary.  This test is used for clinical purposes.  It should not be regarded as investigational or for research.  This laboratory is certified under the Clinical Laboratory Improvement Amendments of 1988 (CLIA-88) as qualified to perform high complexity clinical laboratory testing.    CLINICAL HISTORY  SPECIMEN(S) OBTAINED 1. Surgical [P], Gastric  SPECIMEN COMMENTS: 1. Gastroesophageal reflux disease, unspecified whether esophagitis; gastritis without bleeding, unspecified chronicity, unspecified gastritis type SPECIMEN CLINICAL INFORMATION: 1. R/O H. pylori    Gross Description 1. Received in formalin are tan, soft tissue fragments that are submitted in toto. Number: 4, Size: 0.3 cm smallest to 0.5 cm largest,  (1B) ( TT )        Report signed out from the following  location(s) [place] [facility] [address], [place] [address] CLIA #: [id number]  [facility] [address] [place], [place] [address] CLIA #: [id number]   Lipase, blood     Status: None   Collection Time: 06/21/23 10:05 AM  Result Value Ref Range   Lipase 25 11 - 51 U/L    Comment: Performed at [facility], [address] [place], [place] [address]  Comprehensive metabolic panel     Status: None   Collection Time: 06/21/23 10:05 AM  Result Value Ref Range   Sodium 136 135 - 145 mmol/L   Potassium 3.5 3.5 - 5.1 mmol/L   Chloride 105 98 - 111 mmol/L   CO2 23 22 - 32 mmol/L   Glucose, Bld 87 70 - 99 mg/dL    Comment: Glucose reference range applies only to samples taken after fasting for at least 8 hours.   BUN 13 6 - 20 mg/dL   Creatinine, Ser 6.04 0.44 - 1.00 mg/dL   Calcium 9.1 8.9 - 54.0 mg/dL   Total Protein 7.7 6.5 - 8.1 g/dL   Albumin 4.4 3.5 - 5.0 g/dL   AST 17 15 - 41 U/L   ALT 19 0 - 44 U/L   Alkaline Phosphatase 60 38 - 126 U/L   Total Bilirubin 0.7 0.3 - 1.2 mg/dL   GFR, Estimated >98 >11 mL/min    Comment: (NOTE) Calculated using the CKD-EPI Creatinine Equation (2021)    Anion gap 8 5 - 15    Comment: Performed at [facility], [address] [place], [place] [address]  CBC     Status: Abnormal   Collection Time: 06/21/23 10:05 AM  Result Value Ref Range   WBC 7.4 4.0 - 10.5 K/uL   RBC 5.63 (H) 3.87 - 5.11 MIL/uL   Hemoglobin 15.3 (H) 12.0 - 15.0 g/dL   HCT 29.5 (H) 62.1 - 30.8 %   MCV 86.3 80.0 - 100.0 fL   MCH 27.2 26.0 - 34.0 pg   MCHC 31.5 30.0 - 36.0 g/dL   RDW 65.7 84.6 - 96.2 %   Platelets 284 150 - 400 K/uL   nRBC 0.0 0.0 - 0.2 %    Comment: Performed at [facility], [address] [place], [place] [address]  Urinalysis, Routine w reflex microscopic -Urine, Clean Catch     Status: Abnormal   Collection Time: 06/21/23 10:05 AM  Result Value Ref  Range   Color, Urine STRAW (A) YELLOW   APPearance CLEAR CLEAR   Specific Gravity, Urine 1.005 1.005 - 1.030   pH 8.0 5.0 - 8.0   Glucose, UA NEGATIVE NEGATIVE mg/dL   Hgb urine dipstick NEGATIVE NEGATIVE   Bilirubin Urine NEGATIVE NEGATIVE   Ketones, ur 5 (A) NEGATIVE mg/dL   Protein, ur NEGATIVE NEGATIVE mg/dL   Nitrite NEGATIVE NEGATIVE   Leukocytes,Ua NEGATIVE NEGATIVE    Comment: Performed at [facility], [address] [place], [place] [address]  hCG, serum, qualitative     Status: None   Collection Time: 06/21/23 10:05 AM  Result Value Ref Range   Preg, Serum NEGATIVE NEGATIVE    Comment:        THE SENSITIVITY OF THIS METHODOLOGY IS >10 mIU/mL. Performed at [facility], [address], [place], [place] [address]   B12     Status: None   Collection Time: 07/24/23  2:16 PM  Result Value Ref Range   Vitamin B-12 612 211 - 911 pg/mL  VITAMIN D 25 Hydroxy (Vit-D Deficiency, Fractures)     Status: None   Collection Time: 07/24/23  2:16 PM  Result Value Ref Range   VITD 38.73 30.00 - 100.00 ng/mL        Garner Nash, MD, MS

## 2023-08-15 NOTE — Assessment & Plan Note (Signed)
New onset elevated blood pressure readings at home, highest being approximately 186/122?mmHg measured with a wrist monitor under improper technique. In-office readings are mildly elevated at 140/80?mmHg. Factors potentially contributing include recent stress, increased caffeine intake, and improper home measurement techniques.  Differential Diagnosis:  Essential hypertension Stress-induced blood pressure elevation White-coat hypertension   Plan:  Initiate amlodipine 2.5?mg orally once daily to manage elevated blood pressure. Educated on proper blood pressure measurement techniques using an upper arm cuff. Advise acquisition of an upper arm blood pressure monitor. Instruct to monitor and record blood pressure daily, preferably in the morning before activities, for one month. Discuss potential side effects of amlodipine, including peripheral edema; note that current diuretic therapy with Diamox may mitigate this risk. Continue current medications as prescribed. Follow-Up Recommendations:  Schedule follow-up appointment in one month to reassess blood pressure and evaluate response to therapy.

## 2023-08-15 NOTE — Patient Instructions (Signed)
Start amlodipine 2.5 mg daily for blood pressure  Check blood pressure daily and keep log  Monitor for symptoms of headaches, chest pain, blurry vision, dizziness, swelling, or any other worrisome symptom.  If these occur, please contact the office  If symptoms severe, go to the emergency department.

## 2023-08-24 ENCOUNTER — Other Ambulatory Visit: Payer: Self-pay | Admitting: Family Medicine

## 2023-08-24 DIAGNOSIS — K2100 Gastro-esophageal reflux disease with esophagitis, without bleeding: Secondary | ICD-10-CM

## 2023-08-25 ENCOUNTER — Encounter: Payer: Self-pay | Admitting: Gastroenterology

## 2023-08-25 MED ORDER — SIMETHICONE 125 MG PO CAPS: ORAL_CAPSULE | ORAL | 0 refills | Status: DC

## 2023-08-29 ENCOUNTER — Encounter: Payer: Self-pay | Admitting: *Deleted

## 2023-08-29 NOTE — Telephone Encounter (Signed)
Called patient to inform she needs to be re-tested for the H. Pylor bacteria using the [employer] test kit. Informed the patient to come to the 2nd floor to  receive the kit and once the sample is collected to apply the sticker as well as the date and time of collection onto the sticker, apply to the tube containing the sample and bring to the lab located in the [facility] building. Patient states she will pick up the stool sample kit or [employer] kit today.

## 2023-09-07 IMAGING — MR MR HEAD WO/W CM
12 series · 48 of 48 positions shown · IV contrast (multihance)
Comparison: MRI orbits and MRV head 10/21/2021.

CLINICAL DATA: Provided history: Papilledema associated with
increased intracranial pressure. Additional history provided by
scanning technologist: Patient reports right eye blurriness.

EXAM:
MRI HEAD WITHOUT AND WITH CONTRAST
TECHNIQUE: Multiplanar, multiecho pulse sequences of the brain and surrounding
structures were obtained without and with intravenous contrast.
CONTRAST:  15mL MULTIHANCE GADOBENATE DIMEGLUMINE 529 MG/ML IV SOLN

[Series 2: T1 · sagittal · 5.0mm · 0.47mm/px · 1 of 25 slices shown]
[im 1/25]
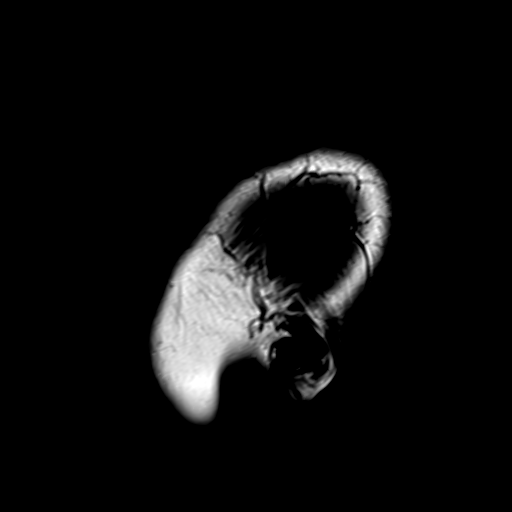

[Series 3: ax ep2d_diff_3 · axial · 3.0mm · 1.80mm/px · z∈[-69,+86]mm · 6 of 105 slices shown]
[im 1/105]
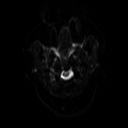
[im 21/105]
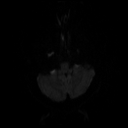
[im 42/105]
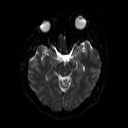
[im 63/105]
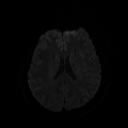
[im 84/105]
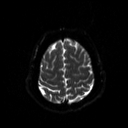
[im 105/105]
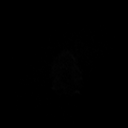

[Series 4: ax ep2d_diff_3_adc · axial · 3.0mm · 1.80mm/px · z∈[-69,+86]mm · 3 of 53 slices shown]
[im 1/53]
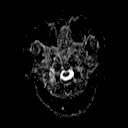
[im 27/53]
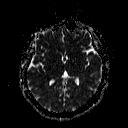
[im 53/53]
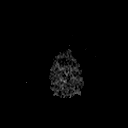

[Series 5: cor ep2d_diff · coronal · 5.0mm · 1.77mm/px · 3 of 53 slices shown]
[im 1/53]
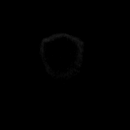
[im 27/53]
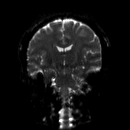
[im 53/53]
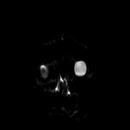

[Series 6: cor ep2d_diff_adc · coronal · 5.0mm · 1.77mm/px · 2 of 27 slices shown]
[im 1/27]
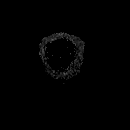
[im 27/27]
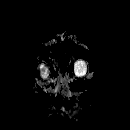

[Series 8: swi_images · axial · 2.0mm · 0.94mm/px · z∈[-69,+88]mm · 5 of 80 slices shown]
[im 1/80]
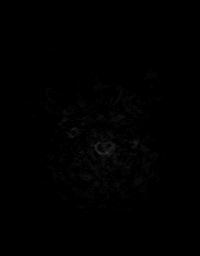
[im 20/80]
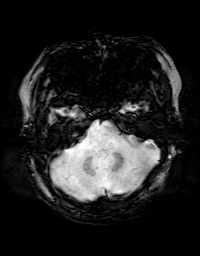
[im 40/80]
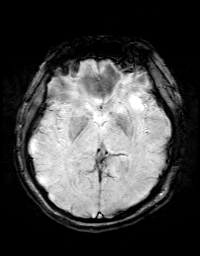
[im 60/80]
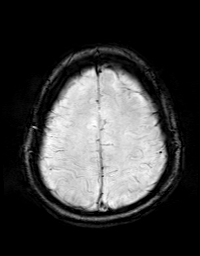
[im 80/80]
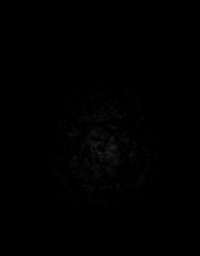

[Series 9: FLAIR · axial · 3.0mm · 0.45mm/px · z∈[-68,+84]mm · 2 of 40 slices shown]
[im 1/40]
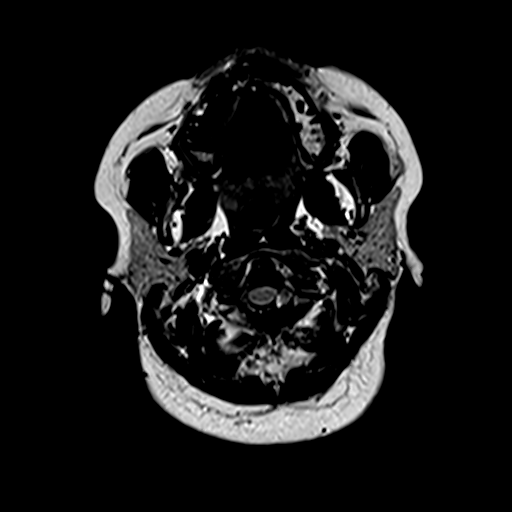
[im 40/40]
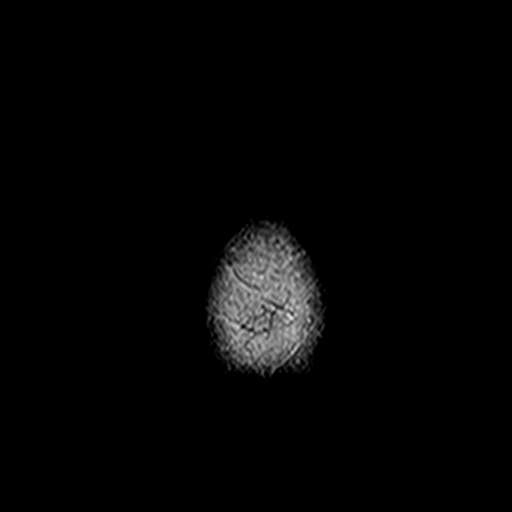

[Series 10: T2 · axial · 5.0mm · 0.65mm/px · z∈[-74,+93]mm · 2 of 29 slices shown (1 of 2)]
[im 1/29]
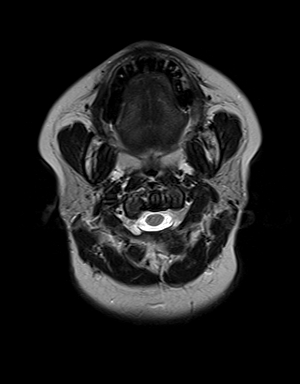
[im 29/29]
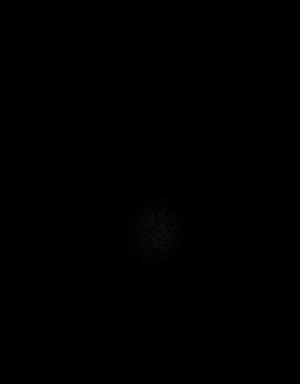

[Series 11: t1_mpr_tra · axial · 1.0mm · 0.72mm/px · z∈[-70,+89]mm · 10 of 160 slices shown]
[im 1/160]
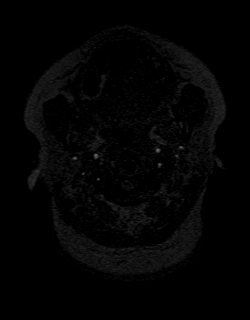
[im 18/160]
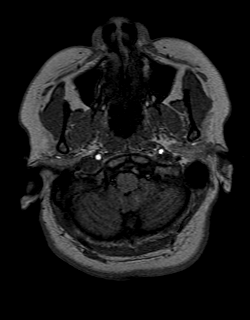
[im 36/160]
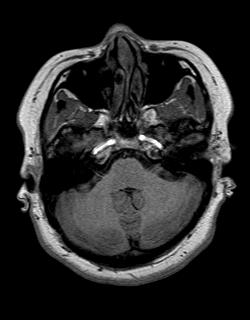
[im 54/160]
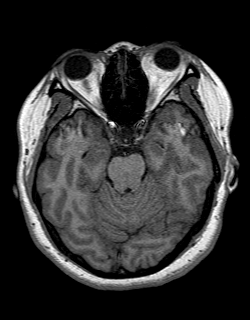
[im 71/160]
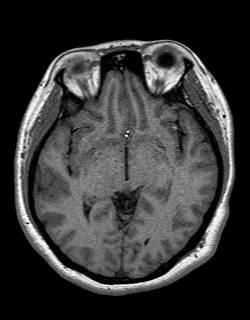
[im 89/160]
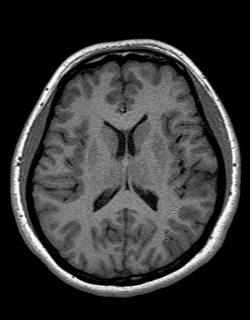
[im 107/160]
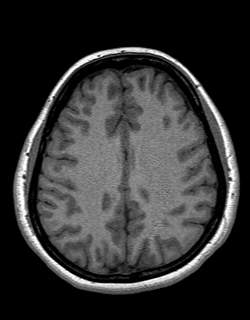
[im 124/160]
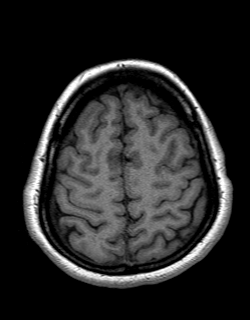
[im 142/160]
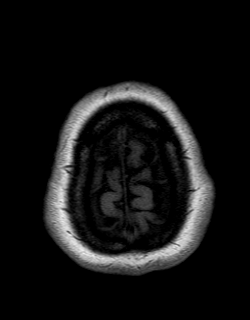
[im 160/160]
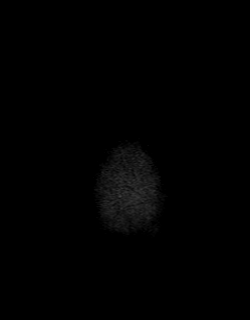

[Series 12: T2 · coronal · 5.0mm · 0.43mm/px · 2 of 28 slices shown (2 of 2)]
[im 1/28]
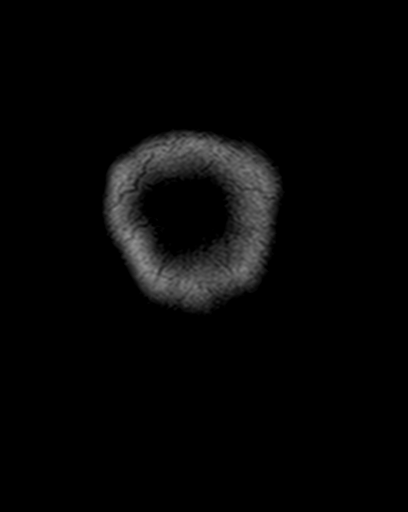
[im 28/28]
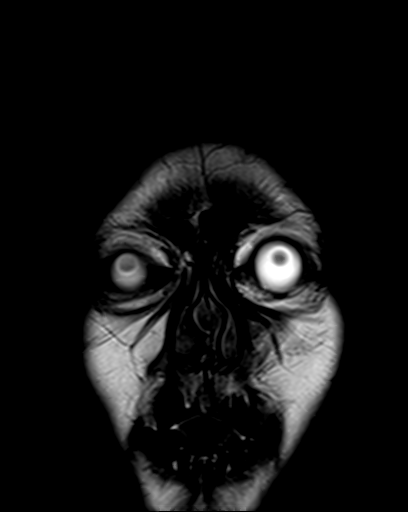

[Series 13: post t1_mpr_tra · axial · 1.0mm · 0.72mm/px · z∈[-70,+89]mm · 10 of 160 slices shown]
[im 1/160]
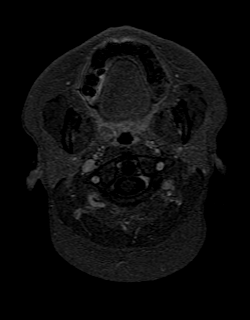
[im 18/160]
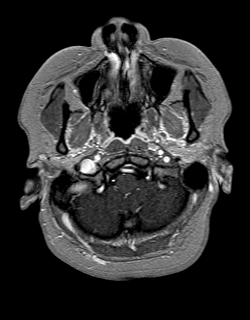
[im 36/160]
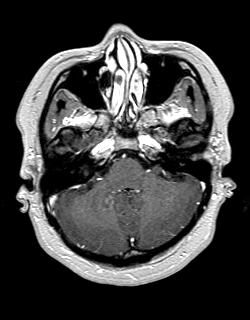
[im 54/160]
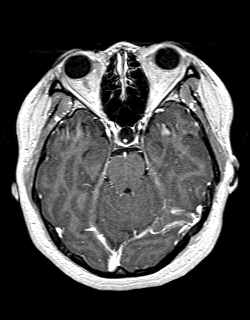
[im 71/160]
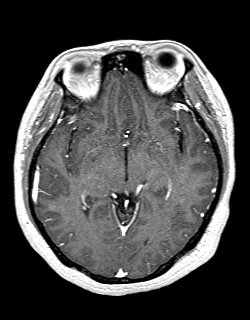
[im 89/160]
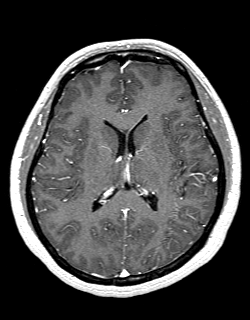
[im 107/160]
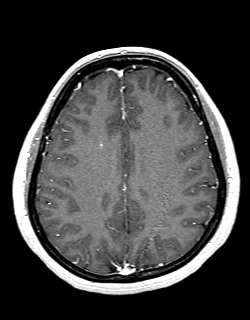
[im 124/160]
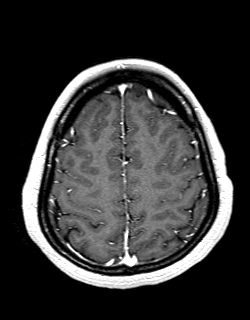
[im 142/160]
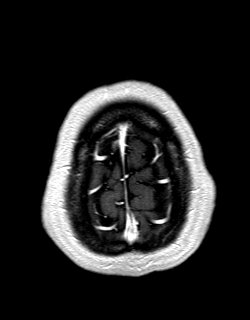
[im 160/160]
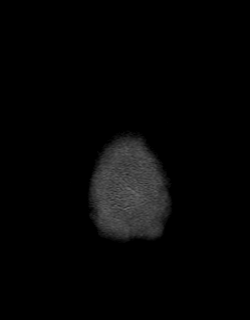

[Series 14: T1 post-contrast · coronal · 5.0mm · 0.90mm/px · 2 of 28 slices shown]
[im 1/28]
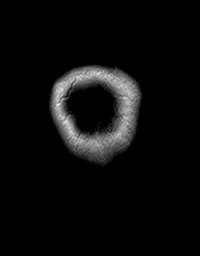
[im 28/28]
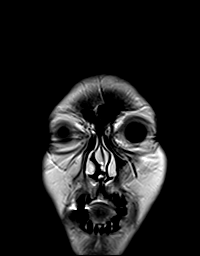

[48 of 48 positions shown; findings below may reference images not displayed]

FINDINGS: Brain:

Cerebral volume is normal.

Partially empty sella turcica.

CSF prominence within Meckel's cave, bilaterally.

No cortical encephalomalacia is identified. No significant cerebral
white matter disease there are 2 punctate foci of T2 FLAIR
hyperintense signal abnormality within the bilateral frontal lobe
white matter (series 9, images 25 and 22).

No cortical encephalomalacia is identified.

There is no acute infarct.

No evidence of an intracranial mass.

No chronic intracranial blood products.

No extra-axial fluid collection.

No midline shift.

No pathologic intracranial enhancement identified.

Vascular: Maintained flow voids within the proximal large arterial
vessels. Small right frontal lobe developmental venous anomaly.

Skull and upper cervical spine: No focal suspicious marrow lesion.

Sinuses/Orbits: Mild CSF prominence within the optic nerve sheaths
bilaterally. Subtle intra-ocular protrusion of the optic nerve heads
was better appreciated on the recent prior MRI orbits of 10/21/2021.
Trace mucosal thickening within the bilateral ethmoid sinuses.
IMPRESSION: Partially empty sella turcica. Mild CSF prominence within the optic
nerve-sheaths. Subtle intra-ocular protrusion of the optic nerve
heads was better appreciated on the recent prior MRI orbits of
10/21/2021. These findings are highly suggestive of idiopathic
intracranial hypertension (pseudotumor cerebri).

Please refer to the recent prior MRV head of 10/21/2021 for a
description of possible distal transverse dural venous sinus
narrowing.

Nonspecific punctate T2 FLAIR hyperintense remote insults within the
bilateral frontal lobes (2 total).

Otherwise unremarkable MRI appearance of the brain.

## 2023-09-08 ENCOUNTER — Encounter: Payer: Self-pay | Admitting: Gastroenterology

## 2023-09-09 ENCOUNTER — Telehealth: Payer: 59 | Admitting: Physician Assistant

## 2023-09-09 ENCOUNTER — Other Ambulatory Visit: Payer: Self-pay | Admitting: Family Medicine

## 2023-09-09 DIAGNOSIS — J069 Acute upper respiratory infection, unspecified: Secondary | ICD-10-CM

## 2023-09-09 DIAGNOSIS — M940 Chondrocostal junction syndrome [Tietze]: Secondary | ICD-10-CM

## 2023-09-09 DIAGNOSIS — I10 Essential (primary) hypertension: Secondary | ICD-10-CM

## 2023-09-09 MED ORDER — PSEUDOEPH-BROMPHEN-DM 30-2-10 MG/5ML PO SYRP
2.5000 mL | ORAL_SOLUTION | Freq: Four times a day (QID) | ORAL | 0 refills | Status: DC | PRN
Start: 2023-09-09 — End: 2023-10-16

## 2023-09-09 MED ORDER — METHYLPREDNISOLONE 4 MG PO TBPK
ORAL_TABLET | ORAL | 0 refills | Status: DC
Start: 2023-09-09 — End: 2023-10-16

## 2023-09-09 MED ORDER — FLUTICASONE PROPIONATE 50 MCG/ACT NA SUSP
2.0000 | Freq: Every day | NASAL | 6 refills | Status: DC
Start: 2023-09-09 — End: 2023-10-16

## 2023-09-09 NOTE — Progress Notes (Signed)
Virtual Visit Consent   Caroline Charles, you are scheduled for a virtual visit with a [facility] provider today. Just as with appointments in the office, your consent must be obtained to participate. Your consent will be active for this visit and any virtual visit you may have with one of our providers in the next 365 days. If you have a MyChart account, a copy of this consent can be sent to you electronically.  As this is a virtual visit, video technology does not allow for your provider to perform a traditional examination. This may limit your provider's ability to fully assess your condition. If your provider identifies any concerns that need to be evaluated in person or the need to arrange testing (such as labs, EKG, etc.), we will make arrangements to do so. Although advances in technology are sophisticated, we cannot ensure that it will always work on either your end or our end. If the connection with a video visit is poor, the visit may have to be switched to a telephone visit. With either a video or telephone visit, we are not always able to ensure that we have a secure connection.  By engaging in this virtual visit, you consent to the provision of healthcare and authorize for your insurance to be billed (if applicable) for the services provided during this visit. Depending on your insurance coverage, you may receive a charge related to this service.  I need to obtain your verbal consent now. Are you willing to proceed with your visit today? BRITTINEE YALDO has provided verbal consent on 09/09/2023 for a virtual visit (video or telephone). Caroline Charles, [place]  Date: 09/09/2023 5:01 PM  Virtual Visit via Video Note   I, Caroline Charles Shirlee Charles, connected with  LORAINA LLANOS  ([id number], Nov 12, 1985) on 09/09/23 at  5:00 PM EST by a video-enabled telemedicine application and verified that I am speaking with the correct person using two identifiers.  Location: Patient: Virtual Visit Location Patient:  Home Provider: Virtual Visit Location Provider: Home Office   I discussed the limitations of evaluation and management by telemedicine and the availability of in person appointments. The patient expressed understanding and agreed to proceed.    History of Present Illness: Caroline Charles is a 37 y.o. who identifies as a female who was assigned female at birth, and is being seen today for viral uri .  HPI: URI  This is a new problem. The current episode started in the past 7 days. The problem has been gradually worsening. There has been no fever. Associated symptoms include congestion, coughing, rhinorrhea, sinus pain and sneezing. Treatments tried: mucinex. The treatment provided no relief.    Problems:  Patient Active Problem List   Diagnosis Date Noted   Primary hypertension 08/15/2023   Vertigo 08/15/2023   Erythrocytosis 06/05/2023   Polydipsia 06/05/2023   Upper airway cough syndrome 05/26/2023   H. pylori infection 03/28/2023   Chronic left shoulder pain 03/28/2023   Vaginal discharge 03/28/2023   Vitamin D deficiency 11/28/2022   Hyperlipidemia 11/28/2022   Birth control counseling 11/28/2022   Fibroid 11/28/2022   Gastroesophageal reflux disease 11/28/2022   Dyspnea 11/28/2022   B12 deficiency 11/28/2022   COVID-19 long hauler manifesting chronic dyspnea 11/01/2022   IIH (idiopathic intracranial hypertension) 11/01/2022    Allergies: No Known Allergies Medications:  Current Outpatient Medications:    acetaZOLAMIDE (DIAMOX) 250 MG tablet, Take 1 tablet (250 mg total) by mouth daily., Disp: 90 tablet, Rfl: 1   amLODipine (  NORVASC) 2.5 MG tablet, TAKE 1 TABLET BY MOUTH EVERY DAY, Disp: 30 tablet, Rfl: 0   Cholecalciferol (VITAMIN D3) 50 MCG (2000 UT) capsule, Take 1 capsule (2,000 Units total) by mouth daily., Disp: 90 capsule, Rfl: 3   famotidine (PEPCID) 20 MG tablet, Take 2 tablets (40 mg total) by mouth at bedtime as needed for heartburn or indigestion., Disp: 90 tablet,  Rfl: 0   hydrocortisone (ANUSOL-HC) 25 MG suppository, Place 1 suppository (25 mg total) rectally 2 (two) times daily. (Patient not taking: Reported on 06/26/2023), Disp: 12 suppository, Rfl: 0   meclizine (ANTIVERT) 12.5 MG tablet, Take by mouth., Disp: , Rfl:    Multiple Vitamins-Minerals (MULTIVITAMIN WITH MINERALS) tablet, Take 1 tablet by mouth daily., Disp: , Rfl:    pantoprazole (PROTONIX) 40 MG tablet, Take 1 tablet (40 mg total) by mouth 2 (two) times daily. Take 30-60 min before eating, Disp: 180 tablet, Rfl: 0   rosuvastatin (CRESTOR) 20 MG tablet, Take 1 tablet (20 mg total) by mouth daily., Disp: 90 tablet, Rfl: 3   Simethicone 125 MG CAPS, Take125 to 250 mg orally as needed after meals and at bedtime; MAX 750 mg/day, Disp: 28 capsule, Rfl: 0   Vitamin D, Ergocalciferol, (DRISDOL) 1.25 MG (50000 UNIT) CAPS capsule, Take 1 capsule (50,000 Units total) by mouth every 7 (seven) days., Disp: 5 capsule, Rfl: 0  Observations/Objective: Patient is well-developed, well-nourished in no acute distress.  Resting comfortably  at home.  Head is normocephalic, atraumatic.  No labored breathing.  Speech is clear and coherent with logical content.  Patient is alert and oriented at baseline.    Assessment and Plan: 1. Costochondritis, acute (Primary)  2. Viral URI with cough patient presents with symptoms suspicious for likely viral upper respiratory infection. Differential includes bacterial pneumonia, sinusitis, allergic rhinitis, . Do not suspect underlying cardiopulmonary process. I considered, but think unlikely, dangerous causes of this patient's symptoms to include ACS, CHF or COPD exacerbations, pneumonia, pneumothorax. Patient is nontoxic appearing and not in need of emergent medical intervention.  Plan: reassurance, reassessment, over the counter medications, discharge with PCP followup   Follow Up Instructions: I discussed the assessment and treatment plan with the patient. The  patient was provided an opportunity to ask questions and all were answered. The patient agreed with the plan and demonstrated an understanding of the instructions.  A copy of instructions were sent to the patient via MyChart unless otherwise noted below.     The patient was advised to call back or seek an in-person evaluation if the symptoms worsen or if the condition fails to improve as anticipated.    Caroline Kidney, PA-C

## 2023-09-09 NOTE — Patient Instructions (Signed)
  Caroline Charles, thank you for joining [name], PA-C for today's virtual visit.  While this provider is not your primary care provider (PCP), if your PCP is located in our provider database this encounter information will be shared with them immediately following your visit.   A [facility] MyChart account gives you access to today's visit and all your visits, tests, and labs performed at [facility] " click here if you don't have a [facility] MyChart account or go to [email or web address]  Consent: (Patient) Caroline Charles provided verbal consent for this virtual visit at the beginning of the encounter.  Current Medications:  Current Outpatient Medications:    acetaZOLAMIDE (DIAMOX) 250 MG tablet, Take 1 tablet (250 mg total) by mouth daily., Disp: 90 tablet, Rfl: 1   amLODipine (NORVASC) 2.5 MG tablet, TAKE 1 TABLET BY MOUTH EVERY DAY, Disp: 30 tablet, Rfl: 0   Cholecalciferol (VITAMIN D3) 50 MCG (2000 UT) capsule, Take 1 capsule (2,000 Units total) by mouth daily., Disp: 90 capsule, Rfl: 3   famotidine (PEPCID) 20 MG tablet, Take 2 tablets (40 mg total) by mouth at bedtime as needed for heartburn or indigestion., Disp: 90 tablet, Rfl: 0   hydrocortisone (ANUSOL-HC) 25 MG suppository, Place 1 suppository (25 mg total) rectally 2 (two) times daily. (Patient not taking: Reported on 06/26/2023), Disp: 12 suppository, Rfl: 0   meclizine (ANTIVERT) 12.5 MG tablet, Take by mouth., Disp: , Rfl:    Multiple Vitamins-Minerals (MULTIVITAMIN WITH MINERALS) tablet, Take 1 tablet by mouth daily., Disp: , Rfl:    pantoprazole (PROTONIX) 40 MG tablet, Take 1 tablet (40 mg total) by mouth 2 (two) times daily. Take 30-60 min before eating, Disp: 180 tablet, Rfl: 0   rosuvastatin (CRESTOR) 20 MG tablet, Take 1 tablet (20 mg total) by mouth daily., Disp: 90 tablet, Rfl: 3   Simethicone 125 MG CAPS, Take125 to 250 mg orally as needed after meals and at bedtime; MAX 750 mg/day, Disp: 28 capsule,  Rfl: 0   Vitamin D, Ergocalciferol, (DRISDOL) 1.25 MG (50000 UNIT) CAPS capsule, Take 1 capsule (50,000 Units total) by mouth every 7 (seven) days., Disp: 5 capsule, Rfl: 0   Medications ordered in this encounter:  No orders of the defined types were placed in this encounter.    *If you need refills on other medications prior to your next appointment, please contact your pharmacy*  Follow-Up: Call back or seek an in-person evaluation if the symptoms worsen or if the condition fails to improve as anticipated.  [facility] [phone]  Other Instructions Follow up with your primary provider if your symptoms do not improve.    If you have been instructed to have an in-person evaluation today at a local Urgent Care facility, please use the link below. It will take you to a list of all of our available [facility], including address, phone number and hours of operation. Please do not delay care.  [facility]  If you or a family member do not have a primary care provider, use the link below to schedule a visit and establish care. When you choose a [facility] primary care physician or advanced practice provider, you gain a long-term partner in health. Find a Primary Care Provider  Learn more about [facility]'s in-office and virtual care options: [place] - Get Care Now

## 2023-09-18 ENCOUNTER — Ambulatory Visit: Payer: 59 | Admitting: Family Medicine

## 2023-09-21 NOTE — Progress Notes (Signed)
 [facility] PRIMARY CARE [facility] [address] [place] [address] Dept: [phone] Dept Fax: [phone]  Acute Care Office Visit  Subjective:   Caroline Charles 1986/01/31 09/22/2023  Chief Complaint  Patient presents with   Cough    Started 2nd week of December- virtual call December 21 and medication not working     HPI: Discussed the use of AI scribe software for clinical note transcription with the patient, who gave verbal consent to proceed.  History of Present Illness   The patient, with a history of pneumonia, presents with a persistent cough that began in early December. The cough lasted for over two weeks, prompting the patient to seek medical attention via a virtual call. They were prescribed a steroid and Flonase , but the cough persisted after the completion of the treatment. The cough is not associated with fever, chills, shortness of breath, or wheezing. The patient describes the cough as productive at times, with light yellow sputum, but often feels like they cannot fully expectorate. The cough is associated with a sore throat and rib pain due to the constant coughing. The patient also reports feeling dehydrated while taking a prescribed cough syrup containing Sudafed, which improved upon discontinuation of the syrup. They have been self-medicating with a OTC remedy from [place], which has helped to suppress the cough and improve their sleep.   Patient also needs refill of Protonix  for GERD. Well controlled on medication.       The following portions of the patient's history were reviewed and updated as appropriate: past medical history, past surgical history, family history, social history, allergies, medications, and problem list.   Patient Active Problem List   Diagnosis Date Noted   Primary hypertension 08/15/2023   Vertigo 08/15/2023   Erythrocytosis 06/05/2023   Polydipsia 06/05/2023   Upper airway cough syndrome 05/26/2023    H. pylori infection 03/28/2023   Chronic left shoulder pain 03/28/2023   Vaginal discharge 03/28/2023   Vitamin D  deficiency 11/28/2022   Hyperlipidemia 11/28/2022   Birth control counseling 11/28/2022   Fibroid 11/28/2022   Gastroesophageal reflux disease 11/28/2022   Dyspnea 11/28/2022   B12 deficiency 11/28/2022   COVID-19 long hauler manifesting chronic dyspnea 11/01/2022   IIH (idiopathic intracranial hypertension) 11/01/2022   Past Medical History:  Diagnosis Date   GERD (gastroesophageal reflux disease)    IIH (idiopathic intracranial hypertension)    Papilledema    Pneumonia    History reviewed. No pertinent surgical history. Family History  Problem Relation Age of Onset   Hypertension Mother    Diabetes Mother    Hyperlipidemia Mother    Ovarian cysts Mother    Diabetes Father    Colon cancer Neg Hx    Rectal cancer Neg Hx    Stomach cancer Neg Hx    Esophageal cancer Neg Hx     Current Outpatient Medications:    acetaZOLAMIDE  (DIAMOX ) 250 MG tablet, Take 1 tablet (250 mg total) by mouth daily., Disp: 90 tablet, Rfl: 1   amLODipine  (NORVASC ) 2.5 MG tablet, TAKE 1 TABLET BY MOUTH EVERY DAY, Disp: 30 tablet, Rfl: 0   azithromycin  (ZITHROMAX ) 250 MG tablet, Take 2 tablets on day 1, then 1 tablet daily on days 2 through 5, Disp: 6 tablet, Rfl: 0   benzonatate  (TESSALON ) 100 MG capsule, Take 1 capsule (100 mg total) by mouth 3 (three) times daily as needed for cough., Disp: 30 capsule, Rfl: 0   famotidine  (PEPCID ) 20 MG tablet, Take 2 tablets (  40 mg total) by mouth at bedtime as needed for heartburn or indigestion., Disp: 90 tablet, Rfl: 0   fluticasone  (FLONASE ) 50 MCG/ACT nasal spray, Place 2 sprays into both nostrils daily., Disp: 16 g, Rfl: 6   hydrocortisone  (ANUSOL -HC) 25 MG suppository, Place 1 suppository (25 mg total) rectally 2 (two) times daily., Disp: 12 suppository, Rfl: 0   Multiple Vitamins-Minerals (MULTIVITAMIN WITH MINERALS) tablet, Take 1 tablet by  mouth daily., Disp: , Rfl:    rosuvastatin  (CRESTOR ) 20 MG tablet, Take 1 tablet (20 mg total) by mouth daily., Disp: 90 tablet, Rfl: 3   Simethicone  125 MG CAPS, Take125 to 250 mg orally as needed after meals and at bedtime; MAX 750 mg/day, Disp: 28 capsule, Rfl: 0   brompheniramine-pseudoephedrine-DM 30-2-10 MG/5ML syrup, Take 2.5 mLs by mouth 4 (four) times daily as needed. (Patient not taking: Reported on 09/22/2023), Disp: 120 mL, Rfl: 0   Cholecalciferol (VITAMIN D3) 50 MCG (2000 UT) capsule, Take 1 capsule (2,000 Units total) by mouth daily. (Patient not taking: Reported on 09/22/2023), Disp: 90 capsule, Rfl: 3   meclizine (ANTIVERT) 12.5 MG tablet, Take by mouth. (Patient not taking: Reported on 09/22/2023), Disp: , Rfl:    methylPREDNISolone  (MEDROL  DOSEPAK) 4 MG TBPK tablet, Take as directed on packaging. (Patient not taking: Reported on 09/22/2023), Disp: 1 each, Rfl: 0   pantoprazole  (PROTONIX ) 40 MG tablet, Take 1 tablet (40 mg total) by mouth 2 (two) times daily. Take 30-60 min before eating, Disp: 180 tablet, Rfl: 0   Vitamin D , Ergocalciferol , (DRISDOL ) 1.25 MG (50000 UNIT) CAPS capsule, Take 1 capsule (50,000 Units total) by mouth every 7 (seven) days. (Patient not taking: Reported on 09/22/2023), Disp: 5 capsule, Rfl: 0 No Known Allergies   ROS: A complete ROS was performed with pertinent positives/negatives noted in the HPI. The remainder of the ROS are negative.    Objective:   Today's Vitals   09/22/23 0804  BP: 132/86  Pulse: 74  Temp: 98.4 F (36.9 C)  TempSrc: Temporal  SpO2: 99%  Weight: 149 lb (67.6 kg)  Height: 5' 5 (1.651 m)    GENERAL: Well-appearing, in NAD. Well nourished.  SKIN: Pink, warm and dry. No rash.  HEENT:    HEAD: Normocephalic, non-traumatic.  EYES: Conjunctive pink without exudate. PERRL.  EARS: External ear w/o redness, swelling, masses, or lesions. EAC clear. TM's intact, translucent w/o bulging, appropriate landmarks visualized.  NOSE: Septum  midline w/o deformity. Nares patent, mucosa pink and non-inflamed w/o drainage. No sinus tenderness.  THROAT: Uvula midline. Oropharynx clear. Tonsils non-inflamed w/o exudate. Mucus membranes pink and moist.  NECK: Trachea midline. Full ROM w/o pain or tenderness. No lymphadenopathy.  RESPIRATORY: Chest wall symmetrical. Respirations even and non-labored. Breath sounds clear to auscultation bilaterally.  CARDIAC: S1, S2 present, regular rate and rhythm. Peripheral pulses 2+ bilaterally.  EXTREMITIES: Without clubbing, cyanosis, or edema.  NEUROLOGIC: Steady, even gait.  PSYCH/MENTAL STATUS: Alert, oriented x 3. Cooperative, appropriate mood and affect.    No results found for any visits on 09/22/23.    Assessment & Plan:  Assessment and Plan    Bronchitis -Order chest X-ray to rule out pneumonia -Prescribe antibiotic -Provide cough suppressant tablets to help manage symptoms.  Gastroesophageal Reflux Disease (GERD) Currently managed with Pantoprazole  twice daily. -Refill Pantoprazole  prescription.  Follow-up If cough worsens or does not improve, patient should return for further evaluation.      Meds ordered this encounter  Medications   pantoprazole  (PROTONIX ) 40 MG tablet  Sig: Take 1 tablet (40 mg total) by mouth 2 (two) times daily. Take 30-60 min before eating    Dispense:  180 tablet    Refill:  0    Supervising Provider:   THOMPSON, AARON B [[id number]]   azithromycin  (ZITHROMAX ) 250 MG tablet    Sig: Take 2 tablets on day 1, then 1 tablet daily on days 2 through 5    Dispense:  6 tablet    Refill:  0    Supervising Provider:   THOMPSON, AARON B [[id number]]   benzonatate  (TESSALON ) 100 MG capsule    Sig: Take 1 capsule (100 mg total) by mouth 3 (three) times daily as needed for cough.    Dispense:  30 capsule    Refill:  0    Supervising Provider:   THOMPSON, AARON B [[id number]]   Orders Placed This Encounter  Procedures   DG Chest 2 View    Standing Status:    Future    Expiration Date:   03/21/2024    Reason for Exam (SYMPTOM  OR DIAGNOSIS REQUIRED):   cough x several weeks    Is the patient pregnant?:   No    Preferred imaging location?:   Internal   Lab Orders  No laboratory test(s) ordered today   No images are attached to the encounter or orders placed in the encounter.  Return if symptoms worsen or fail to improve.   Rosina Senters, FNP

## 2023-09-22 ENCOUNTER — Encounter: Payer: Self-pay | Admitting: Internal Medicine

## 2023-09-22 ENCOUNTER — Ambulatory Visit (INDEPENDENT_AMBULATORY_CARE_PROVIDER_SITE_OTHER): Payer: 59 | Admitting: Internal Medicine

## 2023-09-22 ENCOUNTER — Ambulatory Visit (INDEPENDENT_AMBULATORY_CARE_PROVIDER_SITE_OTHER)
Admission: RE | Admit: 2023-09-22 | Discharge: 2023-09-22 | Disposition: A | Discharge status: Home / Self Care | Payer: 59 | Source: Ambulatory Visit | Attending: Internal Medicine | Admitting: Internal Medicine

## 2023-09-22 VITALS — BP 132/86 | HR 74 | Temp 98.4°F | Ht 65.0 in | Wt 149.0 lb

## 2023-09-22 DIAGNOSIS — R058 Other specified cough: Secondary | ICD-10-CM | POA: Diagnosis not present

## 2023-09-22 DIAGNOSIS — J40 Bronchitis, not specified as acute or chronic: Secondary | ICD-10-CM

## 2023-09-22 DIAGNOSIS — K2100 Gastro-esophageal reflux disease with esophagitis, without bleeding: Secondary | ICD-10-CM

## 2023-09-22 MED ORDER — AZITHROMYCIN 250 MG PO TABS
ORAL_TABLET | ORAL | 0 refills | Status: AC
Start: 2023-09-22 — End: 2023-09-27

## 2023-09-22 MED ORDER — PANTOPRAZOLE SODIUM 40 MG PO TBEC: 40.0000 mg | DELAYED_RELEASE_TABLET | Freq: Two times a day (BID) | ORAL | 0 refills | Status: AC

## 2023-09-22 MED ORDER — BENZONATATE 100 MG PO CAPS
100.0000 mg | ORAL_CAPSULE | Freq: Three times a day (TID) | ORAL | 0 refills | Status: DC | PRN
Start: 2023-09-22 — End: 2023-10-17

## 2023-09-22 NOTE — Patient Instructions (Signed)
 Rest, drink plenty of fluids.   For cough: You can take Over the counter Coricidin HBP cough and chest congestion.   Follow the instructions in the box.  Call anytime if the symptoms are severe, you have high fever, short of breath, chest pain

## 2023-09-27 ENCOUNTER — Ambulatory Visit: Payer: 59 | Admitting: Family Medicine

## 2023-10-11 DIAGNOSIS — H16141 Punctate keratitis, right eye: Secondary | ICD-10-CM | POA: Diagnosis not present

## 2023-10-12 ENCOUNTER — Other Ambulatory Visit: Payer: Self-pay | Admitting: Neurology

## 2023-10-13 NOTE — Progress Notes (Unsigned)
NEUROLOGY FOLLOW UP OFFICE NOTE  Caroline Charles [id number]  Assessment/Plan:   Idiopathic intracranial hypertension Probable migraines   Decrease acetazolamide to 125mg  daily Follow up with Dr. Lita Mains in May and have notes sent to me Follow up 6 months     Subjective:  Caroline Charles is a 38 year old female right who follows up for idiopathic intracranial hypertension.  Ophthalmology note personally reviewed.  UPDATE: Current medication:  acetazolamide 250mg  daily  Decreased acetazolamide to 250mg  once daily.  By October, reported fluctuation with dull tension-type headaches and dizziness.  She had repeat eye exam with Dr. Lita Mains on 11/4 whose exam revealed no papilledema and with full VF.  Infrequently, may have a unilateral headache, described as throbbing, sometimes on left side.  Some mild sensitivity to light but no nausea or phonophobia.  Lasts 2 hours with ibuprofen.  Ongoing for last couple of weeks but no headaches since October.  Right eye always dry.     HISTORY: On routine eye exam, she was evaluated by ophthalmology who noted bilateral papilledema on exam.  Visual field testing was normal.  MRI of brain, orbits and MRV of head with and without contrast performed on 2/2 and 10/23/2021 revealed partially empty sella, mild CSF prominence within the optic nerve sheaths, subtle intra-ocular protrusion of the optic nerve head and possible narrowing of the bilateral distal transverse sinuses but no mass lesion or dural sinus thrombosis noted.  She does have a Mirena IUD.  Reports some weight gain over the past year.  She reports history of migraines which became almost daily due to presumed stress as they resolved after receiving MRI results stating there was no tumor.  Denies visual obscurations or blurred vision.  Hears what sounds like "crickets" in her head but no tinnitus or pulsatile tinnitus.  Underwent lumbar puncture on 01/19/2022 which revealed opening pressure of 31 cm H2O.  Started  on acetazolamide 500mg  twice daily.   Migraine are typically 8/10 pressure in temples and behind both eyes associated with photophobia and sometimes nausea, usually lasts 2-3 days.  She was previously prescribed an acute migraine medication, but did not tolerate it.     PAST MEDICAL HISTORY: Past Medical History:  Diagnosis Date   GERD (gastroesophageal reflux disease)    IIH (idiopathic intracranial hypertension)    Papilledema    Pneumonia     MEDICATIONS: Current Outpatient Medications on File Prior to Visit  Medication Sig Dispense Refill   benzonatate (TESSALON) 100 MG capsule Take 1 capsule (100 mg total) by mouth 3 (three) times daily as needed for cough. 30 capsule 0   famotidine (PEPCID) 20 MG tablet Take 2 tablets (40 mg total) by mouth at bedtime as needed for heartburn or indigestion. 90 tablet 0   hydrocortisone (ANUSOL-HC) 25 MG suppository Place 1 suppository (25 mg total) rectally 2 (two) times daily. 12 suppository 0   Multiple Vitamins-Minerals (MULTIVITAMIN WITH MINERALS) tablet Take 1 tablet by mouth daily.     pantoprazole (PROTONIX) 40 MG tablet Take 1 tablet (40 mg total) by mouth 2 (two) times daily. Take 30-60 min before eating 180 tablet 0   rosuvastatin (CRESTOR) 20 MG tablet Take 1 tablet (20 mg total) by mouth daily. 90 tablet 3   amLODipine (NORVASC) 2.5 MG tablet TAKE 1 TABLET BY MOUTH EVERY DAY (Patient not taking: Reported on 10/16/2023) 30 tablet 0   meclizine (ANTIVERT) 12.5 MG tablet Take by mouth. (Patient not taking: Reported on 10/16/2023)  No current facility-administered medications on file prior to visit.    ALLERGIES: No Known Allergies  FAMILY HISTORY: Family History  Problem Relation Age of Onset   Hypertension Mother    Diabetes Mother    Hyperlipidemia Mother    Ovarian cysts Mother    Diabetes Father    Colon cancer Neg Hx    Rectal cancer Neg Hx    Stomach cancer Neg Hx    Esophageal cancer Neg Hx       Objective:   Blood pressure 135/70, pulse 83, height 5\' 5"  (1.651 m), weight 148 lb (67.1 kg), last menstrual period 08/25/2023, SpO2 96%, unknown if currently breastfeeding. General: No acute distress.  Patient appears well-groomed.   Head:  Normocephalic/atraumatic Neck:  Supple.  No paraspinal tenderness.  Full range of motion. Heart:  Regular rate and rhythm. Neuro:  Alert and oriented.  Speech fluent and not dysarthric.  Language intact.  CN II-XII intact.  Bulk and tone normal.  Muscle strength 5/5 throughout.  Deep tendon reflexes 2+ throughout.  Gait normal.  Romberg negative.    Shon Millet, DO  CC:  Fanny Bien, MD

## 2023-10-16 ENCOUNTER — Encounter: Payer: Self-pay | Admitting: Neurology

## 2023-10-16 ENCOUNTER — Ambulatory Visit: Payer: 59 | Admitting: Neurology

## 2023-10-16 VITALS — BP 135/70 | HR 83 | Ht 65.0 in | Wt 148.0 lb

## 2023-10-16 DIAGNOSIS — R202 Paresthesia of skin: Secondary | ICD-10-CM

## 2023-10-16 DIAGNOSIS — R200 Anesthesia of skin: Secondary | ICD-10-CM | POA: Diagnosis not present

## 2023-10-16 DIAGNOSIS — G932 Benign intracranial hypertension: Secondary | ICD-10-CM

## 2023-10-16 MED ORDER — ACETAZOLAMIDE 125 MG PO TABS: 125.0000 mg | ORAL_TABLET | Freq: Every day | ORAL | 5 refills | Status: DC

## 2023-10-16 MED ORDER — ACETAZOLAMIDE 125 MG PO TABS: 125.0000 mg | ORAL_TABLET | Freq: Every day | ORAL | 2 refills | Status: DC

## 2023-10-16 NOTE — Patient Instructions (Addendum)
Decrease acetazolamide to 125mg  daily.  If symptoms get worse, let me know Limit use of pain relievers to no more than 10 days out of the week to prevent risk of rebound or medication-overuse headache. Buy a wrist splint and wear on the left wrist at night and whenever you can during the day.  If hand/arm numbness not improved after 4-8 weeks, try wearing the wrist splint reverse over the inner elbow crease at night to keep elbows from flexing Follow up 6 months.

## 2023-10-17 ENCOUNTER — Ambulatory Visit (INDEPENDENT_AMBULATORY_CARE_PROVIDER_SITE_OTHER): Payer: 59 | Admitting: Family Medicine

## 2023-10-17 ENCOUNTER — Ambulatory Visit: Payer: 59 | Admitting: Gastroenterology

## 2023-10-17 ENCOUNTER — Encounter: Payer: Self-pay | Admitting: Family Medicine

## 2023-10-17 ENCOUNTER — Encounter: Payer: Self-pay | Admitting: Gastroenterology

## 2023-10-17 VITALS — BP 130/80 | HR 74 | Ht 65.0 in | Wt 150.0 lb

## 2023-10-17 VITALS — BP 128/86 | HR 79 | Temp 97.6°F | Wt 148.2 lb

## 2023-10-17 DIAGNOSIS — K219 Gastro-esophageal reflux disease without esophagitis: Secondary | ICD-10-CM

## 2023-10-17 DIAGNOSIS — G43909 Migraine, unspecified, not intractable, without status migrainosus: Secondary | ICD-10-CM | POA: Diagnosis not present

## 2023-10-17 DIAGNOSIS — I10 Essential (primary) hypertension: Secondary | ICD-10-CM

## 2023-10-17 DIAGNOSIS — K648 Other hemorrhoids: Secondary | ICD-10-CM

## 2023-10-17 DIAGNOSIS — K640 First degree hemorrhoids: Secondary | ICD-10-CM

## 2023-10-17 DIAGNOSIS — Z8619 Personal history of other infectious and parasitic diseases: Secondary | ICD-10-CM

## 2023-10-17 DIAGNOSIS — E559 Vitamin D deficiency, unspecified: Secondary | ICD-10-CM

## 2023-10-17 DIAGNOSIS — R140 Abdominal distension (gaseous): Secondary | ICD-10-CM

## 2023-10-17 MED ORDER — AMLODIPINE BESYLATE 5 MG PO TABS
5.0000 mg | ORAL_TABLET | Freq: Every day | ORAL | 3 refills | Status: DC
Start: 2023-10-17 — End: 2023-12-07

## 2023-10-17 MED ORDER — VITAMIN D3 125 MCG (5000 UT) PO CAPS
5000.0000 [IU] | ORAL_CAPSULE | Freq: Every day | ORAL | 3 refills | Status: AC
Start: 2023-10-17 — End: 2024-10-11

## 2023-10-17 MED ORDER — IBUPROFEN 800 MG PO TABS: 800.0000 mg | ORAL_TABLET | Freq: Three times a day (TID) | ORAL | 0 refills | Status: AC | PRN

## 2023-10-17 NOTE — Progress Notes (Unsigned)
HPI :    Symptoms improved in December. November worst    Past Medical History:  Diagnosis Date   GERD (gastroesophageal reflux disease)    IIH (idiopathic intracranial hypertension)    Papilledema    Pneumonia      History reviewed. No pertinent surgical history. Family History  Problem Relation Age of Onset   Hypertension Mother    Diabetes Mother    Hyperlipidemia Mother    Ovarian cysts Mother    Diabetes Father    Colon cancer Neg Hx    Rectal cancer Neg Hx    Stomach cancer Neg Hx    Esophageal cancer Neg Hx    Social History   Tobacco Use   Smoking status: Never    Passive exposure: Never   Smokeless tobacco: Never  Vaping Use   Vaping status: Never Used  Substance Use Topics   Alcohol use: Not Currently   Drug use: Never   Current Outpatient Medications  Medication Sig Dispense Refill   acetaZOLAMIDE (DIAMOX) 125 MG tablet Take 1 tablet (125 mg total) by mouth daily. 90 tablet 2   amLODipine (NORVASC) 5 MG tablet Take 1 tablet (5 mg total) by mouth daily. 90 tablet 3   Cholecalciferol (VITAMIN D3) 125 MCG (5000 UT) CAPS Take 1 capsule (5,000 Units total) by mouth daily. 90 capsule 3   ibuprofen (ADVIL) 800 MG tablet Take 1 tablet (800 mg total) by mouth every 8 (eight) hours as needed. 30 tablet 0   Multiple Vitamins-Minerals (MULTIVITAMIN WITH MINERALS) tablet Take 1 tablet by mouth daily.     pantoprazole (PROTONIX) 40 MG tablet Take 1 tablet (40 mg total) by mouth 2 (two) times daily. Take 30-60 min before eating 180 tablet 0   rosuvastatin (CRESTOR) 20 MG tablet Take 1 tablet (20 mg total) by mouth daily. 90 tablet 3   famotidine (PEPCID) 20 MG tablet Take 2 tablets (40 mg total) by mouth at bedtime as needed for heartburn or indigestion. 90 tablet 0   No current facility-administered medications for this visit.   No Known Allergies   Review of Systems: All systems reviewed and negative except where noted in HPI.    DG Chest 2 View Result  Date: 09/22/2023 CLINICAL DATA:  Productive cough for the past month. EXAM: CHEST - 2 VIEW COMPARISON:  Chest x-rays dated April 21, 2023. FINDINGS: The heart size and mediastinal contours are within normal limits. Both lungs are clear. The visualized skeletal structures are unremarkable. IMPRESSION: No active cardiopulmonary disease. Electronically Signed   By: Obie Dredge M.D.   On: 09/22/2023 10:24    Physical Exam: BP 130/80   Pulse 74   Ht 5\' 5"  (1.651 m)   Wt 150 lb (68 kg)   LMP 08/25/2023   BMI 24.96 kg/m  Constitutional: Pleasant,well-developed, ***female in no acute distress. HEENT: Normocephalic and atraumatic. Conjunctivae are normal. No scleral icterus. Neck supple.  Cardiovascular: Normal rate, regular rhythm.  Pulmonary/chest: Effort normal and breath sounds normal. No wheezing, rales or rhonchi. Abdominal: Soft, nondistended, nontender. Bowel sounds active throughout. There are no masses palpable. No hepatomegaly. Extremities: no edema Lymphadenopathy: No cervical adenopathy noted. Neurological: Alert and oriented to person place and time. Skin: Skin is warm and dry. No rashes noted. Psychiatric: Normal mood and affect. Behavior is normal.  CBC    Component Value Date/Time   WBC 7.4 06/21/2023 1005   RBC 5.63 (H) 06/21/2023 1005   HGB 15.3 (H) 06/21/2023 1005   HCT  48.6 (H) 06/21/2023 1005   PLT 284 06/21/2023 1005   MCV 86.3 06/21/2023 1005   MCH 27.2 06/21/2023 1005   MCHC 31.5 06/21/2023 1005   RDW 13.0 06/21/2023 1005   LYMPHSABS 1.7 06/05/2023 0945   MONOABS 0.4 06/05/2023 0945   EOSABS 0.0 06/05/2023 0945   BASOSABS 0.0 06/05/2023 0945    CMP     Component Value Date/Time   NA 136 06/21/2023 1005   K 3.5 06/21/2023 1005   CL 105 06/21/2023 1005   CO2 23 06/21/2023 1005   GLUCOSE 87 06/21/2023 1005   BUN 13 06/21/2023 1005   CREATININE 0.77 06/21/2023 1005   CALCIUM 9.1 06/21/2023 1005   PROT 7.7 06/21/2023 1005   ALBUMIN 4.4 06/21/2023  1005   AST 17 06/21/2023 1005   ALT 19 06/21/2023 1005   ALKPHOS 60 06/21/2023 1005   BILITOT 0.7 06/21/2023 1005   GFRNONAA >60 06/21/2023 1005   GFRAA  02/17/2011 0140    >60        The eGFR has been calculated using the MDRD equation. This calculation has not been validated in all clinical situations. eGFR's persistently <60 mL/min signify possible Chronic Kidney Disease.       Latest Ref Rng & Units 06/21/2023   10:05 AM 06/05/2023    9:45 AM 11/28/2022   11:00 AM  CBC EXTENDED  WBC 4.0 - 10.5 K/uL 7.4  6.6  7.1   RBC 3.87 - 5.11 MIL/uL 5.63  5.63  5.55   Hemoglobin 12.0 - 15.0 g/dL 19.1  47.8  29.5   HCT 36.0 - 46.0 % 48.6  48.2  47.1   Platelets 150 - 400 K/uL 284  294.0  344.0   NEUT# 1.4 - 7.7 K/uL  4.4  4.6   Lymph# 0.7 - 4.0 K/uL  1.7  2.0       ASSESSMENT AND PLAN:  Garnette Gunner, MD

## 2023-10-17 NOTE — Progress Notes (Signed)
Assessment/Plan:      Hypertension Blood pressure readings ranging from 120s-150s systolic and 70s-100s diastolic. Currently on Amlodipine 2.5mg  daily. Patient expressed interest in increasing dose to 5mg  daily due to occasional higher readings at home. -Increase Amlodipine to 5mg  daily. -Continue home blood pressure monitoring, twice daily. -Check blood pressure at next visit.  Idiopathic Intracranial Hypertension Currently on Acetazolamide 250mg  daily, though a decrease to 125mg  daily has been recommended by Dr. Everlena Cooper. Patient reports medication is currently out of stock. -Continue Acetazolamide 250mg  daily until 125mg  becomes available.  Headaches Patient reports occasional headaches, which are managed with over-the-counter Advil or prescribed Ibuprofen 800mg . -Refill prescription for Ibuprofen 800mg  as needed for headaches.  Vitamin D Deficiency Patient's Vitamin D levels were normal at last check, but had previously been low. Patient is not currently on a daily Vitamin D supplement, but does take a multivitamin. -Recommend daily over-the-counter Vitamin D3 supplement, 5000 units. -Recheck Vitamin D levels at next visit in March.  Follow-up Patient has a physical scheduled in March. -Bring home blood pressure cuff to next visit for comparison. -Recheck labs at next visit.         Medications Discontinued During This Encounter  Medication Reason   meclizine (ANTIVERT) 12.5 MG tablet    hydrocortisone (ANUSOL-HC) 25 MG suppository    benzonatate (TESSALON) 100 MG capsule    amLODipine (NORVASC) 2.5 MG tablet     No follow-ups on file.    Subjective:   Encounter date: 10/17/2023  Caroline Charles is a 38 y.o. female who has COVID-19 long hauler manifesting chronic dyspnea; IIH (idiopathic intracranial hypertension); Vitamin D deficiency; Hyperlipidemia; Birth control counseling; Fibroid; Gastroesophageal reflux disease; Dyspnea; B12 deficiency; H. pylori infection;  Chronic left shoulder pain; Vaginal discharge; Upper airway cough syndrome; Erythrocytosis; Polydipsia; Primary hypertension; and Vertigo on their problem list..   She  has a past medical history of GERD (gastroesophageal reflux disease), IIH (idiopathic intracranial hypertension), Papilledema, and Pneumonia..   She presents with chief complaint of Medical Management of Chronic Issues (Follow up on b/p. Rx refill amlodpine) .   Discussed the use of AI scribe software for clinical note transcription with the patient, who gave verbal consent to proceed.  History of Present Illness   The patient presents for a follow-up on blood pressure management.  She is being followed for blood pressure management and medication refills. Her blood pressure readings at home range from 120s to 150s over 70s to 100s. She is currently taking amlodipine 2.5 mg daily with no side effects. She is considering increasing the dose to 5 mg due to occasional higher readings at home.  She is also taking acetazolamide 125 mg daily for intracranial hypertension, but due to a stock issue, she is currently taking 250 mg. She experiences occasional headaches, which she manages with Advil or ibuprofen 800 mg as needed. She recalls being offered medication for headaches but prefers not to take it daily.  Regarding her vitamin D levels, she previously had low levels but they were normal a couple of months ago. She does not take a daily vitamin D supplement but takes a multivitamin. Her B12 levels were normal in November, and she does not take a separate B12 supplement.  She follows a diet that includes fish and occasionally chicken, but she does not consume much meat. She has a 'very difficult stomach' and finds it challenging to swallow large multivitamins. No chest pain or shortness of breath.       Review  of Systems  Constitutional:  Negative for chills, diaphoresis, fever, malaise/fatigue and weight loss.  HENT:  Negative  for congestion, ear discharge, ear pain and hearing loss.   Eyes:  Negative for blurred vision, double vision, photophobia, pain, discharge and redness.  Respiratory:  Negative for cough, sputum production, shortness of breath and wheezing.   Cardiovascular:  Negative for chest pain, palpitations and leg swelling.  Gastrointestinal:  Negative for abdominal pain, blood in stool, constipation, diarrhea, heartburn, melena, nausea and vomiting.  Genitourinary:  Negative for dysuria, flank pain, frequency, hematuria and urgency.  Musculoskeletal:  Negative for myalgias.  Skin:  Negative for itching and rash.  Neurological:  Negative for dizziness, tingling, tremors, speech change, seizures, loss of consciousness, weakness and headaches.  Endo/Heme/Allergies:  Negative for polydipsia.  Psychiatric/Behavioral:  Negative for depression, hallucinations, memory loss, substance abuse and suicidal ideas. The patient does not have insomnia.   All other systems reviewed and are negative.   No past surgical history on file.  Outpatient Medications Prior to Visit  Medication Sig Dispense Refill   acetaZOLAMIDE (DIAMOX) 125 MG tablet Take 1 tablet (125 mg total) by mouth daily. 90 tablet 2   Multiple Vitamins-Minerals (MULTIVITAMIN WITH MINERALS) tablet Take 1 tablet by mouth daily.     pantoprazole (PROTONIX) 40 MG tablet Take 1 tablet (40 mg total) by mouth 2 (two) times daily. Take 30-60 min before eating 180 tablet 0   rosuvastatin (CRESTOR) 20 MG tablet Take 1 tablet (20 mg total) by mouth daily. 90 tablet 3   amLODipine (NORVASC) 2.5 MG tablet TAKE 1 TABLET BY MOUTH EVERY DAY 30 tablet 0   famotidine (PEPCID) 20 MG tablet Take 2 tablets (40 mg total) by mouth at bedtime as needed for heartburn or indigestion. 90 tablet 0   benzonatate (TESSALON) 100 MG capsule Take 1 capsule (100 mg total) by mouth 3 (three) times daily as needed for cough. (Patient not taking: Reported on 10/17/2023) 30 capsule 0    hydrocortisone (ANUSOL-HC) 25 MG suppository Place 1 suppository (25 mg total) rectally 2 (two) times daily. (Patient not taking: Reported on 10/17/2023) 12 suppository 0   meclizine (ANTIVERT) 12.5 MG tablet Take by mouth. (Patient not taking: Reported on 10/17/2023)     No facility-administered medications prior to visit.    Family History  Problem Relation Age of Onset   Hypertension Mother    Diabetes Mother    Hyperlipidemia Mother    Ovarian cysts Mother    Diabetes Father    Colon cancer Neg Hx    Rectal cancer Neg Hx    Stomach cancer Neg Hx    Esophageal cancer Neg Hx     Social History   Socioeconomic History   Marital status: Single    Spouse name: Not on file   Number of children: Not on file   Years of education: Not on file   Highest education level: Not on file  Occupational History   Occupation: NAIL TECH  Tobacco Use   Smoking status: Never    Passive exposure: Never   Smokeless tobacco: Never  Vaping Use   Vaping status: Never Used  Substance and Sexual Activity   Alcohol use: Not Currently   Drug use: Never   Sexual activity: Not on file  Other Topics Concern   Not on file  Social History Narrative   Right handed   Drinks caffeine prn   Lives with parents and two children   Currently working   One floor  home   Social Drivers of Corporate investment banker Strain: Not on file  Food Insecurity: Not on file  Transportation Needs: Not on file  Physical Activity: Not on file  Stress: Not on file  Social Connections: Not on file  Intimate Partner Violence: Not on file                                                                                                  Objective:  Physical Exam: BP 128/86   Pulse 79   Temp 97.6 F (36.4 C) (Temporal)   Wt 148 lb 3.2 oz (67.2 kg)   LMP 08/25/2023   SpO2 98%   BMI 24.66 kg/m     Physical Exam Constitutional:      General: She is not in acute distress.    Appearance: Normal appearance.  She is not ill-appearing or toxic-appearing.  HENT:     Head: Normocephalic and atraumatic.     Nose: Nose normal. No congestion.  Eyes:     General: No scleral icterus.    Extraocular Movements: Extraocular movements intact.  Cardiovascular:     Rate and Rhythm: Normal rate and regular rhythm.     Pulses: Normal pulses.     Heart sounds: Normal heart sounds.  Pulmonary:     Effort: Pulmonary effort is normal. No respiratory distress.     Breath sounds: Normal breath sounds.  Abdominal:     General: Abdomen is flat. Bowel sounds are normal.     Palpations: Abdomen is soft.  Musculoskeletal:        General: Normal range of motion.     Right lower leg: No edema.     Left lower leg: No edema.  Lymphadenopathy:     Cervical: No cervical adenopathy.  Skin:    General: Skin is warm and dry.     Findings: No rash.  Neurological:     General: No focal deficit present.     Mental Status: She is alert and oriented to person, place, and time. Mental status is at baseline.  Psychiatric:        Mood and Affect: Mood normal.        Behavior: Behavior normal.        Thought Content: Thought content normal.        Judgment: Judgment normal.     DG Chest 2 View Result Date: 09/22/2023 CLINICAL DATA:  Productive cough for the past month. EXAM: CHEST - 2 VIEW COMPARISON:  Chest x-rays dated April 21, 2023. FINDINGS: The heart size and mediastinal contours are within normal limits. Both lungs are clear. The visualized skeletal structures are unremarkable. IMPRESSION: No active cardiopulmonary disease. Electronically Signed   By: Obie Dredge M.D.   On: 09/22/2023 10:24    Recent Results (from the past 2160 hours)  B12     Status: None   Collection Time: 07/24/23  2:16 PM  Result Value Ref Range   Vitamin B-12 612 211 - 911 pg/mL  VITAMIN D 25 Hydroxy (Vit-D Deficiency, Fractures)     Status: None   Collection  Time: 07/24/23  2:16 PM  Result Value Ref Range   VITD 38.73 30.00 - 100.00  ng/mL        Garner Nash, MD, MS

## 2023-10-17 NOTE — Patient Instructions (Signed)
Start taking Pantoprazole 1 tablet daily for 2 weeks then take 1 tablet every other day for 2 weeks then stop.  Continue taking Famotidine as needed   _______________________________________________________  If your blood pressure at your visit was 140/90 or greater, please contact your primary care physician to follow up on this.  _______________________________________________________  If you are age 37 or older, your body mass index should be between 23-30. Your Body mass index is 24.96 kg/m. If this is out of the aforementioned range listed, please consider follow up with your Primary Care Provider.  If you are age 32 or younger, your body mass index should be between 19-25. Your Body mass index is 24.96 kg/m. If this is out of the aformentioned range listed, please consider follow up with your Primary Care Provider.   ________________________________________________________  The [facility] providers would like to encourage you to use [facility] to communicate with providers for non-urgent requests or questions.  Due to long hold times on the telephone, sending your provider a message by [facility] may be a faster and more efficient way to get a response.  Please allow 48 business hours for a response.  Please remember that this is for non-urgent requests.  _______________________________________________________   Thank you for entrusting me with your care and choosing [facility].  Dr Tomasa Rand

## 2023-10-18 DIAGNOSIS — Z8619 Personal history of other infectious and parasitic diseases: Secondary | ICD-10-CM | POA: Insufficient documentation

## 2023-10-18 DIAGNOSIS — R140 Abdominal distension (gaseous): Secondary | ICD-10-CM | POA: Insufficient documentation

## 2023-10-19 DIAGNOSIS — Z6824 Body mass index (BMI) 24.0-24.9, adult: Secondary | ICD-10-CM | POA: Diagnosis not present

## 2023-10-19 DIAGNOSIS — Z130 Encounter for screening for diseases of the blood and blood-forming organs and certain disorders involving the immune mechanism: Secondary | ICD-10-CM | POA: Diagnosis not present

## 2023-10-19 DIAGNOSIS — Z01419 Encounter for gynecological examination (general) (routine) without abnormal findings: Secondary | ICD-10-CM | POA: Diagnosis not present

## 2023-11-07 ENCOUNTER — Encounter: Payer: Self-pay | Admitting: Gastroenterology

## 2023-11-13 ENCOUNTER — Ambulatory Visit: Payer: 59 | Admitting: Gastroenterology

## 2023-11-27 DIAGNOSIS — H04123 Dry eye syndrome of bilateral lacrimal glands: Secondary | ICD-10-CM | POA: Diagnosis not present

## 2023-12-02 DIAGNOSIS — H04123 Dry eye syndrome of bilateral lacrimal glands: Secondary | ICD-10-CM | POA: Diagnosis not present

## 2023-12-02 DIAGNOSIS — Z79899 Other long term (current) drug therapy: Secondary | ICD-10-CM | POA: Diagnosis not present

## 2023-12-04 ENCOUNTER — Encounter: Payer: 59 | Admitting: Family Medicine

## 2023-12-07 ENCOUNTER — Encounter: Payer: Self-pay | Admitting: Family Medicine

## 2023-12-07 ENCOUNTER — Ambulatory Visit (INDEPENDENT_AMBULATORY_CARE_PROVIDER_SITE_OTHER): Payer: 59 | Admitting: Family Medicine

## 2023-12-07 VITALS — BP 128/82 | HR 66 | Temp 97.5°F | Ht 65.0 in | Wt 145.8 lb

## 2023-12-07 DIAGNOSIS — E559 Vitamin D deficiency, unspecified: Secondary | ICD-10-CM

## 2023-12-07 DIAGNOSIS — G932 Benign intracranial hypertension: Secondary | ICD-10-CM | POA: Diagnosis not present

## 2023-12-07 DIAGNOSIS — Z0000 Encounter for general adult medical examination without abnormal findings: Secondary | ICD-10-CM

## 2023-12-07 DIAGNOSIS — K219 Gastro-esophageal reflux disease without esophagitis: Secondary | ICD-10-CM

## 2023-12-07 DIAGNOSIS — Z79899 Other long term (current) drug therapy: Secondary | ICD-10-CM

## 2023-12-07 DIAGNOSIS — R300 Dysuria: Secondary | ICD-10-CM

## 2023-12-07 DIAGNOSIS — H04123 Dry eye syndrome of bilateral lacrimal glands: Secondary | ICD-10-CM | POA: Diagnosis not present

## 2023-12-07 DIAGNOSIS — E7801 Familial hypercholesterolemia: Secondary | ICD-10-CM | POA: Diagnosis not present

## 2023-12-07 DIAGNOSIS — E538 Deficiency of other specified B group vitamins: Secondary | ICD-10-CM

## 2023-12-07 DIAGNOSIS — I10 Essential (primary) hypertension: Secondary | ICD-10-CM | POA: Diagnosis not present

## 2023-12-07 DIAGNOSIS — Z5181 Encounter for therapeutic drug level monitoring: Secondary | ICD-10-CM

## 2023-12-07 DIAGNOSIS — E7800 Pure hypercholesterolemia, unspecified: Secondary | ICD-10-CM | POA: Diagnosis not present

## 2023-12-07 LAB — CBC WITH DIFFERENTIAL/PLATELET
Basophils Absolute: 0 10*3/uL (ref 0.0–0.1)
Basophils Relative: 0.9 % (ref 0.0–3.0)
Eosinophils Absolute: 0 10*3/uL (ref 0.0–0.7)
Eosinophils Relative: 0.6 % (ref 0.0–5.0)
HCT: 44.2 % (ref 36.0–46.0)
Hemoglobin: 14.2 g/dL (ref 12.0–15.0)
Lymphocytes Relative: 31.9 % (ref 12.0–46.0)
Lymphs Abs: 1.6 10*3/uL (ref 0.7–4.0)
MCHC: 32.1 g/dL (ref 30.0–36.0)
MCV: 86.4 fl (ref 78.0–100.0)
Monocytes Absolute: 0.3 10*3/uL (ref 0.1–1.0)
Monocytes Relative: 6.7 % (ref 3.0–12.0)
Neutro Abs: 3 10*3/uL (ref 1.4–7.7)
Neutrophils Relative %: 59.9 % (ref 43.0–77.0)
Platelets: 292 10*3/uL (ref 150.0–400.0)
RBC: 5.12 Mil/uL — ABNORMAL HIGH (ref 3.87–5.11)
RDW: 13.3 % (ref 11.5–15.5)
WBC: 5.1 10*3/uL (ref 4.0–10.5)

## 2023-12-07 LAB — URINALYSIS, ROUTINE W REFLEX MICROSCOPIC
Bilirubin Urine: NEGATIVE
Hgb urine dipstick: NEGATIVE
Ketones, ur: NEGATIVE
Leukocytes,Ua: NEGATIVE
Nitrite: NEGATIVE
RBC / HPF: NONE SEEN (ref 0–?)
Specific Gravity, Urine: 1.01 (ref 1.000–1.030)
Total Protein, Urine: NEGATIVE
Urine Glucose: NEGATIVE
Urobilinogen, UA: 0.2 (ref 0.0–1.0)
pH: 7.5 (ref 5.0–8.0)

## 2023-12-07 LAB — COMPREHENSIVE METABOLIC PANEL
ALT: 15 U/L (ref 0–35)
AST: 14 U/L (ref 0–37)
Albumin: 4.5 g/dL (ref 3.5–5.2)
Alkaline Phosphatase: 60 U/L (ref 39–117)
BUN: 12 mg/dL (ref 6–23)
CO2: 25 meq/L (ref 19–32)
Calcium: 9.2 mg/dL (ref 8.4–10.5)
Chloride: 107 meq/L (ref 96–112)
Creatinine, Ser: 0.73 mg/dL (ref 0.40–1.20)
GFR: 104.95 mL/min (ref >60.00)
Glucose, Bld: 87 mg/dL (ref 70–99)
Potassium: 3.8 meq/L (ref 3.5–5.1)
Sodium: 139 meq/L (ref 135–145)
Total Bilirubin: 0.6 mg/dL (ref 0.2–1.2)
Total Protein: 6.8 g/dL (ref 6.0–8.3)

## 2023-12-07 LAB — MAGNESIUM: Magnesium: 2.1 mg/dL (ref 1.5–2.5)

## 2023-12-07 LAB — LIPID PANEL
Cholesterol: 166 mg/dL (ref 0–200)
HDL: 72.1 mg/dL (ref >39.00)
LDL Cholesterol: 82 mg/dL (ref 0–99)
NonHDL: 94.05
Total CHOL/HDL Ratio: 2
Triglycerides: 62 mg/dL (ref 0.0–149.0)
VLDL: 12.4 mg/dL (ref 0.0–40.0)

## 2023-12-07 LAB — MICROALBUMIN / CREATININE URINE RATIO
Creatinine,U: 74.4 mg/dL
Microalb Creat Ratio: UNDETERMINED mg/g (ref 0.0–30.0)
Microalb, Ur: <0.7 mg/dL

## 2023-12-07 LAB — HEMOGLOBIN A1C: Hgb A1c MFr Bld: 5.3 % (ref 4.6–6.5)

## 2023-12-07 LAB — VITAMIN B12: Vitamin B-12: 478 pg/mL (ref 211–911)

## 2023-12-07 MED ORDER — AMLODIPINE BESYLATE 5 MG PO TABS: 5.0000 mg | ORAL_TABLET | Freq: Every day | ORAL | 3 refills | Status: AC

## 2023-12-07 MED ORDER — MULTI-VITAMIN/MINERALS PO TABS: 1.0000 | ORAL_TABLET | Freq: Every day | ORAL | 3 refills | Status: AC

## 2023-12-07 MED ORDER — ROSUVASTATIN CALCIUM 20 MG PO TABS: 20.0000 mg | ORAL_TABLET | Freq: Every day | ORAL | 3 refills | Status: AC

## 2023-12-07 MED ORDER — MULTI-VITAMIN/MINERALS PO TABS: 1.0000 | ORAL_TABLET | Freq: Every day | ORAL | Status: DC

## 2023-12-07 NOTE — Progress Notes (Signed)
 Assessment  Assessment/Plan:    Idiopathic Intracranial Hypertension She has idiopathic intracranial hypertension managed by Dr. Everlena Cooper, with headaches in the right eye but no significant vision problems. - Continue management with Dr. Everlena Cooper - Discuss potential medication adjustments with Dr. Everlena Cooper  Hypertension Hypertension is well-controlled with office readings at 128/82 mmHg and similar home readings. She tolerates amlodipine well. - Check renal, electrolytes, liver function, thyroid, microalbumin/cr ration, UA w/ micro - Continue amlodipine 5 mg daily - Monitor blood pressure at home biweekly - Follow up in 6 months  Gastroesophageal Reflux Disease (GERD) GERD is well-managed with lifestyle modifications, including avoiding spicy foods, drinking water, and eating smaller meals. She has successfully weaned off pantoprazole under gastroenterologist guidance.  - Check B12, Magnesium, CBC - Continue lifestyle modifications  Hyperlipidemia Hyperlipidemia is managed with rosuvastatin to maintain cholesterol levels. - Check lipid - Continue rosuvastatin - Refill rosuvastatin prescription  Chronic Dry Eye Chronic dry eye affects the right eye, managed with cyclosporine (Restasis) drops twice daily in both eyes. Over-the-counter hydrating drops are recommended for additional relief. - Continue cyclosporine (Restasis) 0.5% drops twice daily in both eyes - Use over-the-counter hydrating drops as needed   Dysuria Check UA for infection  Vitamin Deficiency B12 and D - Recheck levels - Continue multivitamin and Vitamin D supplemenation         Medications Discontinued During This Encounter  Medication Reason   famotidine (PEPCID) 20 MG tablet    Multiple Vitamins-Minerals (MULTIVITAMIN WITH MINERALS) tablet Reorder   rosuvastatin (CRESTOR) 20 MG tablet Reorder   amLODipine (NORVASC) 5 MG tablet Reorder   Multiple Vitamins-Minerals (MULTIVITAMIN WITH MINERALS) tablet      Patient Counseling(The following topics were reviewed and/or handout was given):  -Nutrition: Stressed importance of moderation in sodium/caffeine intake, saturated fat and cholesterol, caloric balance, sufficient intake of fresh fruits, vegetables, and fiber.  -Stressed the importance of regular exercise.   -Substance Abuse: Discussed cessation/primary prevention of tobacco, alcohol, or other drug use; driving or other dangerous activities under the influence; availability of treatment for abuse.   -Injury prevention: Discussed safety belts, safety helmets, smoke detector, smoking near bedding or upholstery.   -Sexuality: Discussed sexually transmitted diseases, partner selection, use of condoms, avoidance of unintended pregnancy and contraceptive alternatives.   -Dental health: Discussed importance of regular tooth brushing, flossing, and dental visits.  -Health maintenance and immunizations reviewed. Please refer to Health maintenance section.  Return to care in 1 year for next preventative visit.        Subjective:  Chief complaint Encounter date: 12/07/2023  Chief Complaint  Patient presents with   Annual Exam    Fasting. Discuss lutein and zeaxanhin for vision.    Discussed the use of AI scribe software for clinical note transcription with the patient, who gave verbal consent to proceed.  History of Present Illness   Caroline Charles is a 38 year old female who presents for a physical exam and follow-up on blood pressure management.  Her blood pressure readings at home have been stable, generally in the 120s to 130s systolic and 70s to 80s diastolic. She is currently taking amlodipine 5 mg daily and tolerating it well. No chest pain, shortness of breath, or other cardiovascular symptoms.  She has a history of chronic dry eye, for which she uses cyclosporine (Restasis) drops twice daily in both eyes. Her right eye, which had a previous issue requiring dye removal, is particularly  bothersome. She also uses over-the-counter hydrating eye drops as  needed. She experiences vision difficulties, particularly when working, and has been advised to use reading glasses with a 1.5 magnification.  She experiences headaches associated with idiopathic intracranial hypertension, for which she takes acetazolamide. The headaches are persistent, and she notes that the dry eyes may be exacerbated by the medication.  She has a history of heartburn, which she manages with lifestyle modifications such as avoiding spicy foods and eating smaller meals. She was previously advised to take pantoprazole twice a week but has been able to reduce her usage.  She reports a sensation of soreness when urinating in the mornings, which she attributes to possible dehydration. She also notes an increase in vaginal discharge, described as 'white stuff', but denies itching or significant discomfort.  She is currently taking rosuvastatin for cholesterol management and vitamin D supplements. She finds the multivitamin with minerals difficult to swallow and is considering alternatives like chewables or gummies.       Review of Systems  Constitutional:  Negative for chills, diaphoresis, fever, malaise/fatigue and weight loss.  HENT:  Negative for congestion, ear discharge, ear pain and hearing loss.   Eyes:  Positive for blurred vision and pain. Negative for double vision, photophobia, discharge and redness.  Respiratory:  Negative for cough, sputum production, shortness of breath and wheezing.   Cardiovascular:  Negative for chest pain, palpitations and leg swelling.  Gastrointestinal:  Negative for abdominal pain, blood in stool, constipation, diarrhea, heartburn, melena, nausea and vomiting.  Genitourinary:  Positive for dysuria. Negative for flank pain, frequency, hematuria and urgency.  Musculoskeletal:  Negative for myalgias.  Skin:  Negative for itching and rash.  Neurological:  Positive for headaches.  Negative for dizziness, tingling, tremors, speech change, seizures, loss of consciousness and weakness.  Endo/Heme/Allergies:  Negative for polydipsia.  Psychiatric/Behavioral:  Negative for depression, hallucinations, memory loss, substance abuse and suicidal ideas. The patient does not have insomnia.   All other systems reviewed and are negative.      12/07/2023    8:53 AM 09/22/2023    8:04 AM 07/24/2023    1:50 PM 03/28/2023    4:39 PM 03/06/2023    4:11 PM  Depression screen PHQ 2/9  Decreased Interest 0 0  0 0  Down, Depressed, Hopeless 0 0 0 0 0  PHQ - 2 Score 0 0 0 0 0  Altered sleeping 1   0   Tired, decreased energy 1   1   Change in appetite 0   2   Feeling bad or failure about yourself  0   0   Trouble concentrating 0   0   Moving slowly or fidgety/restless 0   0   Suicidal thoughts 0   0   PHQ-9 Score 2   3   Difficult doing work/chores Not difficult at all   Not difficult at all        12/07/2023    8:53 AM 03/28/2023    4:39 PM 10/31/2022   10:14 AM  GAD 7 : Generalized Anxiety Score  Nervous, Anxious, on Edge 0 3 1  Control/stop worrying 0 1 0  Worry too much - different things 1 3 1   Trouble relaxing 1 1 0  Restless 0 0 0  Easily annoyed or irritable 0 0 0  Afraid - awful might happen 1 3 0  Total GAD 7 Score 3 11 2   Anxiety Difficulty Not difficult at all Not difficult at all Not difficult at all    There are no  preventive care reminders to display for this patient.     PMH:  The following were reviewed and entered/updated in epic: Past Medical History:  Diagnosis Date   GERD (gastroesophageal reflux disease)    IIH (idiopathic intracranial hypertension)    Papilledema    Pneumonia     Patient Active Problem List   Diagnosis Date Noted   Bloating 10/18/2023   History of Helicobacter pylori infection 10/18/2023   Primary hypertension 08/15/2023   Vertigo 08/15/2023   Erythrocytosis 06/05/2023   Polydipsia 06/05/2023   Upper airway cough  syndrome 05/26/2023   H. pylori infection 03/28/2023   Chronic left shoulder pain 03/28/2023   Vaginal discharge 03/28/2023   Vitamin D deficiency 11/28/2022   Hyperlipidemia 11/28/2022   Birth control counseling 11/28/2022   Fibroid 11/28/2022   Gastroesophageal reflux disease 11/28/2022   Dyspnea 11/28/2022   B12 deficiency 11/28/2022   COVID-19 long hauler manifesting chronic dyspnea 11/01/2022   IIH (idiopathic intracranial hypertension) 11/01/2022    History reviewed. No pertinent surgical history.  Family History  Problem Relation Age of Onset   Hypertension Mother    Diabetes Mother    Hyperlipidemia Mother    Ovarian cysts Mother    Diabetes Father    Colon cancer Neg Hx    Rectal cancer Neg Hx    Stomach cancer Neg Hx    Esophageal cancer Neg Hx     Medications- reviewed and updated Outpatient Medications Prior to Visit  Medication Sig Dispense Refill   acetaZOLAMIDE (DIAMOX) 125 MG tablet Take 1 tablet (125 mg total) by mouth daily. 90 tablet 2   Cholecalciferol (VITAMIN D3) 125 MCG (5000 UT) CAPS Take 1 capsule (5,000 Units total) by mouth daily. 90 capsule 3   cycloSPORINE (RESTASIS) 0.05 % ophthalmic emulsion Place 1 drop into both eyes 2 (two) times daily.     ibuprofen (ADVIL) 800 MG tablet Take 1 tablet (800 mg total) by mouth every 8 (eight) hours as needed. 30 tablet 0   amLODipine (NORVASC) 5 MG tablet Take 1 tablet (5 mg total) by mouth daily. 90 tablet 3   Multiple Vitamins-Minerals (MULTIVITAMIN WITH MINERALS) tablet Take 1 tablet by mouth daily.     rosuvastatin (CRESTOR) 20 MG tablet Take 1 tablet (20 mg total) by mouth daily. 90 tablet 3   pantoprazole (PROTONIX) 40 MG tablet Take 1 tablet (40 mg total) by mouth 2 (two) times daily. Take 30-60 min before eating (Patient not taking: Reported on 12/07/2023) 180 tablet 0   famotidine (PEPCID) 20 MG tablet Take 2 tablets (40 mg total) by mouth at bedtime as needed for heartburn or indigestion. 90 tablet 0    No facility-administered medications prior to visit.    No Known Allergies  Social History   Socioeconomic History   Marital status: Single    Spouse name: Not on file   Number of children: Not on file   Years of education: Not on file   Highest education level: Not on file  Occupational History   Occupation: [employer]  Tobacco Use   Smoking status: Never    Passive exposure: Never   Smokeless tobacco: Never  Vaping Use   Vaping status: Never Used  Substance and Sexual Activity   Alcohol use: Not Currently   Drug use: Never   Sexual activity: Not on file  Other Topics Concern   Not on file  Social History Narrative   Right handed   Drinks caffeine prn   Lives with parents  and two children   Currently working   One floor home   Social Drivers of Health   Financial Resource Strain: Not on file  Food Insecurity: Not on file  Transportation Needs: Not on file  Physical Activity: Not on file  Stress: Not on file  Social Connections: Not on file           Objective:  Physical Exam: BP 128/82   Pulse 66   Temp (!) 97.5 F (36.4 C) (Temporal)   Ht 5\' 5"  (1.651 m)   Wt 145 lb 12.8 oz (66.1 kg)   LMP 11/23/2023   SpO2 99%   BMI 24.26 kg/m   Body mass index is 24.26 kg/m. Wt Readings from Last 3 Encounters:  12/07/23 145 lb 12.8 oz (66.1 kg)  10/17/23 150 lb (68 kg)  10/17/23 148 lb 3.2 oz (67.2 kg)    Physical Exam Constitutional:      General: She is not in acute distress.    Appearance: Normal appearance. She is not ill-appearing or toxic-appearing.  HENT:     Head: Normocephalic and atraumatic.     Right Ear: Hearing, tympanic membrane, ear canal and external ear normal. There is no impacted cerumen.     Left Ear: Hearing, tympanic membrane, ear canal and external ear normal. There is no impacted cerumen.     Nose: Nose normal. No congestion.     Mouth/Throat:     Lips: No lesions.     Mouth: Mucous membranes are moist.     Pharynx:  Oropharynx is clear. No oropharyngeal exudate.  Eyes:     General: No scleral icterus.       Right eye: No discharge.        Left eye: No discharge.     Conjunctiva/sclera: Conjunctivae normal.     Pupils: Pupils are equal, round, and reactive to light.  Neck:     Thyroid: No thyroid mass, thyromegaly or thyroid tenderness.  Cardiovascular:     Rate and Rhythm: Normal rate and regular rhythm.     Pulses: Normal pulses.     Heart sounds: Normal heart sounds.  Pulmonary:     Effort: Pulmonary effort is normal. No respiratory distress.     Breath sounds: Normal breath sounds.  Abdominal:     General: Abdomen is flat. Bowel sounds are normal.     Palpations: Abdomen is soft.  Musculoskeletal:        General: Normal range of motion.     Cervical back: Normal range of motion.     Right lower leg: No edema.     Left lower leg: No edema.  Lymphadenopathy:     Cervical: No cervical adenopathy.  Skin:    General: Skin is warm and dry.     Findings: No rash.  Neurological:     General: No focal deficit present.     Mental Status: She is alert and oriented to person, place, and time. Mental status is at baseline.     Deep Tendon Reflexes:     Reflex Scores:      Patellar reflexes are 2+ on the right side and 2+ on the left side. Psychiatric:        Mood and Affect: Mood normal.        Behavior: Behavior normal.        Thought Content: Thought content normal.        Judgment: Judgment normal.         At today's  visit, we discussed treatment options, associated risk and benefits, and engage in counseling as needed.  Additionally the following were reviewed: Past medical records, past medical and surgical history, family and social background, as well as relevant laboratory results, imaging findings, and specialty notes, where applicable.  This message was generated using dictation software, and as a result, it may contain unintentional typos or errors.  Nevertheless, extensive effort  was made to accurately convey at the pertinent aspects of the patient visit.    There may have been are other unrelated non-urgent complaints, but due to the busy schedule and the amount of time already spent with her, time does not permit to address these issues at today's visit. Another appointment may have or has been requested to review these additional issues.     Thomes Dinning, MD, MS

## 2023-12-11 ENCOUNTER — Encounter: Payer: Self-pay | Admitting: Family Medicine

## 2023-12-11 LAB — VITAMIN D 1,25 DIHYDROXY
Vitamin D 1, 25 (OH)2 Total: 31 pg/mL (ref 18–72)
Vitamin D2 1, 25 (OH)2: <8 pg/mL
Vitamin D3 1, 25 (OH)2: 31 pg/mL

## 2023-12-11 LAB — THYROID PANEL WITH TSH
Free Thyroxine Index: 1.8 (ref 1.4–3.8)
T3 Uptake: 27 % (ref 22–35)
T4, Total: 6.6 ug/dL (ref 5.1–11.9)
TSH: 1.55 m[IU]/L

## 2023-12-21 ENCOUNTER — Other Ambulatory Visit: Payer: Self-pay

## 2023-12-26 DIAGNOSIS — H04123 Dry eye syndrome of bilateral lacrimal glands: Secondary | ICD-10-CM | POA: Diagnosis not present

## 2023-12-26 DIAGNOSIS — H10413 Chronic giant papillary conjunctivitis, bilateral: Secondary | ICD-10-CM | POA: Diagnosis not present

## 2023-12-26 DIAGNOSIS — Z79899 Other long term (current) drug therapy: Secondary | ICD-10-CM | POA: Diagnosis not present

## 2023-12-26 DIAGNOSIS — H16141 Punctate keratitis, right eye: Secondary | ICD-10-CM | POA: Diagnosis not present

## 2024-04-11 DIAGNOSIS — H04123 Dry eye syndrome of bilateral lacrimal glands: Secondary | ICD-10-CM | POA: Diagnosis not present

## 2024-04-11 DIAGNOSIS — Z79899 Other long term (current) drug therapy: Secondary | ICD-10-CM | POA: Diagnosis not present

## 2024-04-11 DIAGNOSIS — H10413 Chronic giant papillary conjunctivitis, bilateral: Secondary | ICD-10-CM | POA: Diagnosis not present

## 2024-04-17 NOTE — Progress Notes (Unsigned)
 NEUROLOGY FOLLOW UP OFFICE NOTE  Caroline Charles [id number]  Assessment/Plan:   Idiopathic intracranial hypertension Probable migraines   Decrease acetazolamide  to 125mg  daily Follow up with Dr. Raj in May and have notes sent to me Follow up 6 months     Subjective:  Caroline Charles is a 38 year old female right who follows up for idiopathic intracranial hypertension.  Ophthalmology note personally reviewed.  UPDATE: Current medication:  acetazolamide  120mg  daily  Decreased acetazolamide  to 250mg  once daily.  By October, reported fluctuation with dull tension-type headaches and dizziness.  She had repeat eye exam with Dr. Raj on 11/4 whose exam revealed no papilledema and with full VF.  Decreased acetazolamide  to 125mg  daily in January.  ***   HISTORY: On routine eye exam, she was evaluated by ophthalmology who noted bilateral papilledema on exam.  Visual field testing was normal.  MRI of brain, orbits and MRV of head with and without contrast performed on 2/2 and 10/23/2021 revealed partially empty sella, mild CSF prominence within the optic nerve sheaths, subtle intra-ocular protrusion of the optic nerve head and possible narrowing of the bilateral distal transverse sinuses but no mass lesion or dural sinus thrombosis noted.  She does have a Mirena IUD.  Reports some weight gain over that past year.  She reports history of migraines which became almost daily due to presumed stress as they resolved after receiving MRI results stating there was no tumor.  Denies visual obscurations or blurred vision.  Hears what sounds like crickets in her head but no tinnitus or pulsatile tinnitus.  Underwent lumbar puncture on 01/19/2022 which revealed opening pressure of 31 cm H2O.  Started on acetazolamide  500mg  twice daily.  Papilledema subsequently resolved and symptoms improved.  Acetazolamide  was steadily decreased to 250mg  daily.  She would sometimes get a dull tension-type headache.  Sometimes she  would experience a unilateral (often left-sided) throbbing headache with mild photophobia but no nausea or phonophobia, lasting 2 hours with ibuprofen .     Migraine are typically 8/10 pressure in temples and behind both eyes associated with photophobia and sometimes nausea, usually lasts 2-3 days.  She was previously prescribed an acute migraine medication, but did not tolerate it.     PAST MEDICAL HISTORY: Past Medical History:  Diagnosis Date   GERD (gastroesophageal reflux disease)    IIH (idiopathic intracranial hypertension)    Papilledema    Pneumonia     MEDICATIONS: Current Outpatient Medications on File Prior to Visit  Medication Sig Dispense Refill   acetaZOLAMIDE  (DIAMOX ) 125 MG tablet Take 1 tablet (125 mg total) by mouth daily. 90 tablet 2   amLODipine  (NORVASC ) 5 MG tablet Take 1 tablet (5 mg total) by mouth daily. 90 tablet 3   Cholecalciferol (VITAMIN D3) 125 MCG (5000 UT) CAPS Take 1 capsule (5,000 Units total) by mouth daily. 90 capsule 3   cycloSPORINE (RESTASIS) 0.05 % ophthalmic emulsion Place 1 drop into both eyes 2 (two) times daily.     ibuprofen  (ADVIL ) 800 MG tablet Take 1 tablet (800 mg total) by mouth every 8 (eight) hours as needed. 30 tablet 0   Multiple Vitamins-Minerals (MULTIVITAMIN WITH MINERALS) tablet Take 1 tablet by mouth daily. 90 tablet 3   pantoprazole  (PROTONIX ) 40 MG tablet Take 1 tablet (40 mg total) by mouth 2 (two) times daily. Take 30-60 min before eating (Patient not taking: Reported on 12/07/2023) 180 tablet 0   rosuvastatin  (CRESTOR ) 20 MG tablet Take 1 tablet (20 mg total)  by mouth daily. 90 tablet 3   No current facility-administered medications on file prior to visit.    ALLERGIES: No Known Allergies  FAMILY HISTORY: Family History  Problem Relation Age of Onset   Hypertension Mother    Diabetes Mother    Hyperlipidemia Mother    Ovarian cysts Mother    Diabetes Father    Colon cancer Neg Hx    Rectal cancer Neg Hx     Stomach cancer Neg Hx    Esophageal cancer Neg Hx       Objective:  Blood pressure 135/70, pulse 83, height 5' 5 (1.651 m), weight 148 lb (67.1 kg), last menstrual period 08/25/2023, SpO2 96%, unknown if currently breastfeeding. General: No acute distress.  Patient appears well-groomed.   Head:  Normocephalic/atraumatic Neck:  Supple.  No paraspinal tenderness.  Full range of motion. Heart:  Regular rate and rhythm. Neuro:  Alert and oriented.  Speech fluent and not dysarthric.  Language intact.  CN II-XII intact.  Bulk and tone normal.  Muscle strength 5/5 throughout.  Deep tendon reflexes 2+ throughout.  Gait normal.  Romberg negative.    Juliene Dunnings, DO  CC:  Beverley Hummer, MD

## 2024-04-18 ENCOUNTER — Encounter: Payer: Self-pay | Admitting: Neurology

## 2024-04-18 ENCOUNTER — Ambulatory Visit: Payer: 59 | Admitting: Neurology

## 2024-04-18 VITALS — BP 124/72 | HR 73 | Ht 65.0 in | Wt 145.0 lb

## 2024-04-18 DIAGNOSIS — G43009 Migraine without aura, not intractable, without status migrainosus: Secondary | ICD-10-CM

## 2024-04-18 DIAGNOSIS — G44219 Episodic tension-type headache, not intractable: Secondary | ICD-10-CM

## 2024-04-18 DIAGNOSIS — G932 Benign intracranial hypertension: Secondary | ICD-10-CM | POA: Diagnosis not present

## 2024-05-06 ENCOUNTER — Other Ambulatory Visit: Payer: Self-pay | Admitting: Internal Medicine

## 2024-05-30 ENCOUNTER — Other Ambulatory Visit: Payer: Self-pay | Admitting: Internal Medicine

## 2024-06-02 ENCOUNTER — Other Ambulatory Visit: Payer: Self-pay | Admitting: Internal Medicine

## 2024-06-06 ENCOUNTER — Ambulatory Visit (INDEPENDENT_AMBULATORY_CARE_PROVIDER_SITE_OTHER): Admitting: Family Medicine

## 2024-06-06 ENCOUNTER — Encounter: Payer: Self-pay | Admitting: Family Medicine

## 2024-06-06 VITALS — BP 136/84 | HR 70 | Temp 97.0°F | Resp 18 | Wt 157.0 lb

## 2024-06-06 DIAGNOSIS — I10 Essential (primary) hypertension: Secondary | ICD-10-CM | POA: Diagnosis not present

## 2024-06-06 DIAGNOSIS — E7801 Familial hypercholesterolemia: Secondary | ICD-10-CM

## 2024-06-06 DIAGNOSIS — Z23 Encounter for immunization: Secondary | ICD-10-CM | POA: Diagnosis not present

## 2024-06-06 LAB — LIPID PANEL
Cholesterol: 186 mg/dL (ref 0–200)
HDL: 69.7 mg/dL (ref >39.00)
LDL Cholesterol: 103 mg/dL — ABNORMAL HIGH (ref 0–99)
NonHDL: 116.16
Total CHOL/HDL Ratio: 3
Triglycerides: 65 mg/dL (ref 0.0–149.0)
VLDL: 13 mg/dL (ref 0.0–40.0)

## 2024-06-06 LAB — COMPREHENSIVE METABOLIC PANEL WITH GFR
ALT: 17 U/L (ref 0–35)
AST: 16 U/L (ref 0–37)
Albumin: 4.5 g/dL (ref 3.5–5.2)
Alkaline Phosphatase: 44 U/L (ref 39–117)
BUN: 12 mg/dL (ref 6–23)
CO2: 29 meq/L (ref 19–32)
Calcium: 9.3 mg/dL (ref 8.4–10.5)
Chloride: 103 meq/L (ref 96–112)
Creatinine, Ser: 0.71 mg/dL (ref 0.40–1.20)
GFR: 108.13 mL/min (ref >60.00)
Glucose, Bld: 88 mg/dL (ref 70–99)
Potassium: 4.6 meq/L (ref 3.5–5.1)
Sodium: 138 meq/L (ref 135–145)
Total Bilirubin: 0.5 mg/dL (ref 0.2–1.2)
Total Protein: 6.9 g/dL (ref 6.0–8.3)

## 2024-06-06 NOTE — Progress Notes (Unsigned)
 Assessment & Plan   Assessment/Plan:    Problem List Items Addressed This Visit   None Visit Diagnoses       Immunization due    -  Primary   Relevant Orders   Flu vaccine trivalent PF, 6mos and older(Flulaval,Afluria,Fluarix,Fluzone)           Assessment and Plan Assessment & Plan Hypertension Hypertension is well-controlled with current medication regimen. - Continue amlodipine  with 90-day supply as prescribed.  Hyperlipidemia Hyperlipidemia is well-managed with rosuvastatin . Previous lab results showed excellent cholesterol levels. - Check lipid panel to monitor cholesterol levels. - Continue rosuvastatin  as prescribed.  Gastroesophageal reflux disease (GERD) GERD symptoms are well-managed with pantoprazole  and famotidine  as needed. Recent episode of blood clots in the stomach resolved with medication. - Continue pantoprazole  and famotidine  as needed.  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 Symptoms have improved with current treatment. - Continue Zyrtec daily. - Use gel drops at night for dry eyes.  Contact dermatitis Mild contact dermatitis with transient itching. Symptoms improved with antihistamine use. - Apply hydrocortisone  cream to affected area as needed.  Vitamin D  deficiency Vitamin D  levels previously in good range with current supplementation. - Continue vitamin D  supplementation at 5000 IU daily.      There are no discontinued medications.  No follow-ups on file.        Subjective:   Encounter date: 06/06/2024  Caroline Charles is a 38 y.o. female who has COVID-19 long hauler manifesting chronic dyspnea; IIH (idiopathic intracranial hypertension); Vitamin D  deficiency; Hyperlipidemia; Birth control counseling; Fibroid; Gastroesophageal reflux disease; Dyspnea; B12 deficiency; H. pylori infection; Chronic left shoulder pain; Vaginal  discharge; Upper airway cough syndrome; Erythrocytosis; Polydipsia; Primary hypertension; Vertigo; Bloating; and History of Helicobacter pylori infection on their problem list..   She  has a past medical history of GERD (gastroesophageal reflux disease), IIH (idiopathic intracranial hypertension), Papilledema, and Pneumonia..   She presents with chief complaint of Hypertension (6 month follow up. Pt is fasting today //HM due- vaccinations ) .   Discussed the use of AI scribe software for clinical note transcription with the patient, who gave verbal consent to proceed.  History of Present Illness Caroline Charles is a 38 year old female with hypertension and hyperlipidemia who presents for follow-up on blood pressure and cholesterol management.  Hypertension and hyperlipidemia management - Blood pressure managed with amlodipine , received in a 90-day supply - No current symptoms related to hypertension - Cholesterol managed with rosuvastatin , no muscle aches reported - Takes 5000 IU of vitamin D  daily  Headache symptoms - Significant improvement in headache frequency - Only occasional headaches in the past month - No longer taking medication for headaches  Gastrointestinal symptoms - Two weeks ago, experienced gastrointestinal issues with blood clots - Managed with pantoprazole  and promethazine at night - Uses simethicone  for gas relief - Continues regular pantoprazole  use and famotidine  as a backup at night - No regular use of ibuprofen , only as needed for pain - History of H. 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 No pertinent surgical history.  Outpatient Medications Prior to Visit  Medication Sig Dispense Refill  . amLODipine  (NORVASC ) 5 MG tablet Take 1 tablet (5 mg total) by mouth daily. 90 tablet 3  . Cholecalciferol (VITAMIN D -3) 125 MCG (5000 UT) TABS Take 5,000 Units by mouth daily.    . Cholecalciferol (VITAMIN D3) 125 MCG (5000 UT) CAPS Take 1 capsule (5,000 Units total) by mouth daily. 90 capsule 3  . cycloSPORINE (RESTASIS) 0.05 % ophthalmic emulsion Place 1 drop into both eyes 2 (two) times daily.    . famotidine  (PEPCID ) 20 MG tablet TAKE 1 TABLET BY MOUTH EVERY DAY AFTER SUPPER 30 tablet 0  . ibuprofen  (ADVIL ) 800 MG tablet Take 1 tablet (800 mg total) by mouth every 8 (eight) hours as needed. 30 tablet 0  . Multiple Vitamins-Minerals (MULTIVITAMIN WITH MINERALS) tablet Take 1 tablet by mouth daily. 90 tablet 3  . pantoprazole  (PROTONIX ) 40 MG tablet Take 1 tablet (40 mg total) by mouth 2 (two) times daily. Take 30-60 min before eating 180 tablet 0  . rosuvastatin  (CRESTOR ) 20 MG tablet Take 1 tablet (20 mg total) by mouth daily. 90 tablet 3   No facility-administered medications prior to visit.    Family History  Problem Relation Age of Onset  . Hypertension Mother   . Diabetes Mother   . Hyperlipidemia Mother   . Ovarian cysts Mother   . Diabetes Father   . Colon cancer Neg Hx   . Rectal cancer Neg Hx   . Stomach cancer Neg Hx   . Esophageal cancer Neg Hx     Social History   Socioeconomic History  . Marital status: Single    Spouse name: Not on file  . Number of children: Not on file  . Years of education: Not on file  . Highest education level: Not on file  Occupational History  . Occupation: NAIL TECH  Tobacco Use  . Smoking status: Never    Passive exposure: Never  . Smokeless tobacco: Never  Vaping Use   . Vaping status: Never Used  Substance and Sexual Activity  . Alcohol use: Not Currently  . Drug use: Never  . Sexual activity: Not Currently  Other Topics Concern  . Not on file  Social History Narrative   Right handed   Drinks caffeine prn   Lives with parents and two children   Currently working   One floor home   Social Drivers of Health   Financial Resource Strain: Not on file  Food Insecurity: Not on file  Transportation Needs: Not on file  Physical Activity: Not on file  Stress: Not on file  Social Connections: Not on file  Intimate Partner Violence: Not on file                                                                                                  Objective:  Physical Exam: BP 136/84 (BP Location: Left Arm, Patient Position: Sitting,  Cuff Size: Normal)   Pulse 70   Temp (!) 97 F (36.1 C) (Temporal)   Resp 18   Wt 157 lb (71.2 kg)   LMP 06/06/2024 (Exact Date)   SpO2 99%   Breastfeeding No   BMI 26.13 kg/m    Physical Exam GENERAL: Alert, cooperative, well developed, no acute distress HEENT: Normocephalic, normal oropharynx, moist mucous membranes CHEST: Clear to auscultation bilaterally, No wheezes, rhonchi, or crackles CARDIOVASCULAR: Normal heart rate and rhythm, S1 and S2 normal without murmurs ABDOMEN: Soft, non-tender, non-distended, without organomegaly, Normal bowel sounds EXTREMITIES: No cyanosis or edema NEUROLOGICAL: Cranial nerves grossly intact, Moves all extremities without gross motor or sensory deficit   Physical Exam  No results found.  No results found for this or any previous visit (from the past 2160 hours).      Beverley Adine Hummer, MD, MS

## 2024-06-07 ENCOUNTER — Ambulatory Visit: Payer: Self-pay | Admitting: Family Medicine

## 2024-07-23 DIAGNOSIS — H4713 Papilledema associated with retinal disorder: Secondary | ICD-10-CM | POA: Diagnosis not present

## 2024-10-31 ENCOUNTER — Ambulatory Visit: Payer: Self-pay | Admitting: Neurology

## 2024-12-12 ENCOUNTER — Encounter: Admitting: Family Medicine
